# Patient Record
Sex: Male | Born: 1995 | Race: Black or African American | Hispanic: No | Marital: Single | Smoking: Never smoker
Health system: Southern US, Community
[De-identification: ages and names within clinical notes are randomized; demographics above are authoritative.]

## PROBLEM LIST (undated history)

## (undated) DIAGNOSIS — L309 Dermatitis, unspecified: Secondary | ICD-10-CM

## (undated) HISTORY — DX: Dermatitis, unspecified: L30.9

---
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
## 2014-04-28 ENCOUNTER — Emergency Department (INDEPENDENT_AMBULATORY_CARE_PROVIDER_SITE_OTHER)
Admission: EM | Admit: 2014-04-28 | Discharge: 2014-04-28 | Disposition: A | Discharge status: Home / Self Care | Payer: Medicaid Other | Source: Home / Self Care | Attending: Family Medicine | Admitting: Family Medicine

## 2014-04-28 ENCOUNTER — Encounter (HOSPITAL_COMMUNITY): Payer: Self-pay | Admitting: Emergency Medicine

## 2014-04-28 DIAGNOSIS — N508 Other specified disorders of male genital organs: Secondary | ICD-10-CM

## 2014-04-28 DIAGNOSIS — N5082 Scrotal pain: Secondary | ICD-10-CM

## 2014-04-28 MED ORDER — DOXYCYCLINE HYCLATE 100 MG PO CAPS: 100.0000 mg | ORAL_CAPSULE | Freq: Two times a day (BID) | ORAL | Status: DC

## 2014-04-28 NOTE — ED Provider Notes (Signed)
CSN: [id number]     Arrival date & time 04/28/14  [id number] History   First MD Initiated Contact with Patient 04/28/14 1047     Chief Complaint  Patient presents with  . Groin Swelling   (Consider location/radiation/quality/duration/timing/severity/associated sxs/prior Treatment) Patient is a 19 y.o. male presenting with male genitourinary complaint.  Male GU Problem Presenting symptoms: scrotal pain   Presenting symptoms: no penile discharge and no penile pain   Context comment:  Left scrotal swelling this am, now resolved. Relieved by:  None tried Associated symptoms: scrotal swelling   Associated symptoms: no abdominal pain, no diarrhea, no fever, no genital itching, no genital lesions, no groin pain, no hematuria, no penile redness and no penile swelling   Associated symptoms comment:  Had std screen last week.   No past medical history on file. No past surgical history on file. No family history on file. History  Substance Use Topics  . Smoking status: Never Smoker   . Smokeless tobacco: Not on file  . Alcohol Use: No    Review of Systems  Constitutional: Negative.  Negative for fever.  Gastrointestinal: Negative for abdominal pain and diarrhea.  Genitourinary: Positive for scrotal swelling. Negative for hematuria, discharge, penile swelling, penile pain and testicular pain.  Musculoskeletal: Negative.     Allergies  Review of patient's allergies indicates no known allergies.  Home Medications   Prior to Admission medications   Medication Sig Start Date End Date Taking? Authorizing Provider  acetaminophen-codeine (TYLENOL #3) 300-30 MG per tablet Take 1 tablet by mouth every 6 (six) hours as needed for pain. 01/10/12   Adlih Moreno-Coll, MD  amoxicillin (AMOXIL) 500 MG capsule Take 1 capsule (500 mg total) by mouth 3 (three) times daily. 01/10/12   Adlih Moreno-Coll, MD  doxycycline (VIBRAMYCIN) 100 MG capsule Take 1 capsule (100 mg total) by mouth 2 (two) times daily.  04/28/14   Linna HoffJames D Parish Augustine, MD  fexofenadine (ALLEGRA) 180 MG tablet Take 1 tablet (180 mg total) by mouth daily. 05/04/12   Linna HoffJames D Welda Azzarello, MD  fluticasone (FLONASE) 50 MCG/ACT nasal spray Place 1 spray into the nose 2 (two) times daily. 05/04/12   Linna HoffJames D Vallie Teters, MD  ibuprofen (ADVIL,MOTRIN) 600 MG tablet Take 1 tablet (600 mg total) by mouth every 8 (eight) hours as needed for pain or fever. 01/10/12   Adlih Moreno-Coll, MD   BP 121/80 mmHg  Pulse 109  Temp(Src) 98.2 F (36.8 C) (Oral)  Resp 14  SpO2 100% Physical Exam  Constitutional: He is oriented to person, place, and time. He appears well-developed and well-nourished.  Abdominal: Soft. Bowel sounds are normal.  Genitourinary: Penis normal. No penile tenderness.  Nl exam at present, no sx.  Neurological: He is alert and oriented to person, place, and time.  Skin: Skin is warm and dry.  Nursing note and vitals reviewed.   ED Course  Procedures (including critical care time) Labs Review Labs Reviewed - No data to display  Imaging Review No results found.   MDM   1. Scrotal pain        Linna HoffJames D Joshua Zeringue, MD 04/28/14 1115

## 2014-04-28 NOTE — Discharge Instructions (Signed)
Use medicine as needed if problem comes back.

## 2014-04-28 NOTE — ED Notes (Signed)
Pt states that he woke up with his testicles swollen this morning

## 2015-08-09 ENCOUNTER — Emergency Department (HOSPITAL_COMMUNITY)
Admission: EM | Admit: 2015-08-09 | Discharge: 2015-08-10 | Disposition: A | Discharge status: Home / Self Care | Attending: Emergency Medicine | Admitting: Emergency Medicine

## 2015-08-09 ENCOUNTER — Encounter (HOSPITAL_COMMUNITY): Payer: Self-pay | Admitting: Emergency Medicine

## 2015-08-09 DIAGNOSIS — Y92410 Unspecified street and highway as the place of occurrence of the external cause: Secondary | ICD-10-CM | POA: Insufficient documentation

## 2015-08-09 DIAGNOSIS — M791 Myalgia: Secondary | ICD-10-CM | POA: Diagnosis not present

## 2015-08-09 DIAGNOSIS — Y939 Activity, unspecified: Secondary | ICD-10-CM | POA: Diagnosis not present

## 2015-08-09 DIAGNOSIS — R51 Headache: Secondary | ICD-10-CM | POA: Diagnosis present

## 2015-08-09 DIAGNOSIS — Y999 Unspecified external cause status: Secondary | ICD-10-CM | POA: Insufficient documentation

## 2015-08-09 DIAGNOSIS — Z79899 Other long term (current) drug therapy: Secondary | ICD-10-CM | POA: Diagnosis not present

## 2015-08-09 DIAGNOSIS — M7918 Myalgia, other site: Secondary | ICD-10-CM

## 2015-08-09 MED ORDER — NAPROXEN 500 MG PO TABS: 500.0000 mg | ORAL_TABLET | Freq: Two times a day (BID) | ORAL | 0 refills | Status: DC

## 2015-08-09 NOTE — ED Notes (Signed)
PA at bedside.
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
## 2015-08-09 NOTE — Discharge Instructions (Signed)
Apply ice to affected area. Take Naprosyn as needed for pain and inflammation. Follow up with your primary care doctor for symptoms not improved. Return to the emergency department if you experience loss of consciousness, slurred speech, vomiting, blurry vision, loss of control of your bowel or bladder, numbness and tingling in both of the lower extremities, weakness.
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
## 2015-08-29 ENCOUNTER — Encounter (HOSPITAL_COMMUNITY): Payer: Self-pay | Admitting: Emergency Medicine

## 2015-08-29 ENCOUNTER — Emergency Department (HOSPITAL_COMMUNITY)

## 2015-08-29 DIAGNOSIS — Z5321 Procedure and treatment not carried out due to patient leaving prior to being seen by health care provider: Secondary | ICD-10-CM | POA: Insufficient documentation

## 2015-08-29 DIAGNOSIS — R079 Chest pain, unspecified: Secondary | ICD-10-CM | POA: Insufficient documentation

## 2015-08-29 LAB — I-STAT TROPONIN, ED: Troponin i, poc: 0.01 ng/mL (ref 0.00–0.08)

## 2015-08-29 LAB — CBC
HEMATOCRIT: 41.4 % (ref 39.0–52.0)
HEMOGLOBIN: 14.1 g/dL (ref 13.0–17.0)
MCH: 29.7 pg (ref 26.0–34.0)
MCHC: 34.1 g/dL (ref 30.0–36.0)
MCV: 87.3 fL (ref 78.0–100.0)
Platelets: 233 10*3/uL (ref 150–400)
RBC: 4.74 MIL/uL (ref 4.22–5.81)
RDW: 12.1 % (ref 11.5–15.5)
WBC: 7.5 10*3/uL (ref 4.0–10.5)

## 2015-08-29 LAB — BASIC METABOLIC PANEL
ANION GAP: 4 — AB (ref 5–15)
BUN: 10 mg/dL (ref 6–20)
CO2: 29 mmol/L (ref 22–32)
Calcium: 9.5 mg/dL (ref 8.9–10.3)
Chloride: 106 mmol/L (ref 101–111)
Creatinine, Ser: 1.23 mg/dL (ref 0.61–1.24)
GFR calc Af Amer: >60 mL/min (ref >60)
GFR calc non Af Amer: >60 mL/min (ref >60)
GLUCOSE: 101 mg/dL — AB (ref 65–99)
POTASSIUM: 4 mmol/L (ref 3.5–5.1)
Sodium: 139 mmol/L (ref 135–145)

## 2015-08-29 NOTE — ED Triage Notes (Signed)
Pt. reports central / left chest pain with SOB onset 4 days ago , denies nausea or diaphoresis .

## 2015-08-30 ENCOUNTER — Emergency Department (HOSPITAL_COMMUNITY)
Admission: EM | Admit: 2015-08-30 | Discharge: 2015-08-30 | Disposition: A | Discharge status: Home / Self Care | Attending: Dermatology | Admitting: Dermatology

## 2015-08-30 ENCOUNTER — Emergency Department (HOSPITAL_COMMUNITY)
Admission: EM | Admit: 2015-08-30 | Discharge: 2015-08-30 | Disposition: A | Discharge status: Home / Self Care | Attending: Emergency Medicine | Admitting: Emergency Medicine

## 2015-08-30 ENCOUNTER — Encounter (HOSPITAL_COMMUNITY): Payer: Self-pay | Admitting: Emergency Medicine

## 2015-08-30 ENCOUNTER — Emergency Department (HOSPITAL_COMMUNITY)

## 2015-08-30 DIAGNOSIS — Z5321 Procedure and treatment not carried out due to patient leaving prior to being seen by health care provider: Secondary | ICD-10-CM | POA: Insufficient documentation

## 2015-08-30 DIAGNOSIS — R079 Chest pain, unspecified: Secondary | ICD-10-CM | POA: Insufficient documentation

## 2015-08-30 LAB — BASIC METABOLIC PANEL
Anion gap: 7 (ref 5–15)
BUN: 16 mg/dL (ref 6–20)
CALCIUM: 9.2 mg/dL (ref 8.9–10.3)
CHLORIDE: 107 mmol/L (ref 101–111)
CO2: 26 mmol/L (ref 22–32)
CREATININE: 1.02 mg/dL (ref 0.61–1.24)
GFR calc non Af Amer: >60 mL/min (ref >60)
Glucose, Bld: 97 mg/dL (ref 65–99)
Potassium: 3.8 mmol/L (ref 3.5–5.1)
SODIUM: 140 mmol/L (ref 135–145)

## 2015-08-30 LAB — I-STAT TROPONIN, ED: TROPONIN I, POC: 0.01 ng/mL (ref 0.00–0.08)

## 2015-08-30 LAB — CBC
HCT: 40.9 % (ref 39.0–52.0)
Hemoglobin: 14 g/dL (ref 13.0–17.0)
MCH: 29.4 pg (ref 26.0–34.0)
MCHC: 34.2 g/dL (ref 30.0–36.0)
MCV: 85.9 fL (ref 78.0–100.0)
PLATELETS: 251 10*3/uL (ref 150–400)
RBC: 4.76 MIL/uL (ref 4.22–5.81)
RDW: 12.5 % (ref 11.5–15.5)
WBC: 8.3 10*3/uL (ref 4.0–10.5)

## 2015-08-30 NOTE — ED Triage Notes (Signed)
Pt states for the past 4 days he has been getting chest pain with strenuous activity   Pt states his heart rate goes up and he has some shortness of breath too  Pt states it takes a while for his heart rate to come down after he stops to rest

## 2015-08-30 NOTE — ED Notes (Signed)
Family is taking the pt to [place] ed  They are tired of waiting here

## 2015-08-30 NOTE — ED Notes (Signed)
Family member asking about the wait time  Time given  Will wait for now

## 2015-08-30 NOTE — ED Notes (Signed)
Pt informed registration that he was leaving.

## 2016-06-05 ENCOUNTER — Encounter: Payer: Self-pay | Admitting: Podiatry

## 2016-06-05 ENCOUNTER — Ambulatory Visit (INDEPENDENT_AMBULATORY_CARE_PROVIDER_SITE_OTHER): Admitting: Podiatry

## 2016-06-05 VITALS — BP 119/67 | HR 80 | Resp 16 | Ht 72.0 in | Wt 180.0 lb

## 2016-06-05 DIAGNOSIS — L600 Ingrowing nail: Secondary | ICD-10-CM | POA: Diagnosis not present

## 2016-06-05 NOTE — Patient Instructions (Signed)

## 2016-06-05 NOTE — Progress Notes (Signed)
   Subjective:    Patient ID: Albert Moon, male    DOB: 05/22/1995, 21 y.o.   MRN: [id number]  HPI Chief Complaint  Patient presents with  . Nail Problem    Left foot; great toe-medial side; pt stated, "Has had for the past 2 years"      Review of Systems  Musculoskeletal: Positive for back pain and myalgias.  All other systems reviewed and are negative.      Objective:   Physical Exam        Assessment & Plan:

## 2016-06-05 NOTE — Progress Notes (Signed)
Subjective:    Patient ID: Albert Moon, male   DOB: 21 y.o.   MRN: [id number]   HPI patient states I have a painful left hallux nail and states that he's tried to trim it himself and soak without relief    ROS      Objective:  Physical Exam Neurovascular status intact with incurvated left hallux medial border that's painful when pressed and makes shoe gear difficult    Assessment:   Ingrown toenail deformity left hallux medial border with pain distal redness but no active drainage      Plan:    H&P condition reviewed at great length and discussed correction. He wants this done and I explained risk and today I infiltrated the left hallux 60 Milligan times like Marcaine mixture remove the border exposed matrix and applied phenol 3 applications 30 seconds followed by alcohol lavaged sterile dressing. Gave instructions on soaks and reappoint

## 2018-03-18 IMAGING — CR DG CHEST 2V
2 series · 2 of 2 positions shown · non-contrast
Comparison: None.

CLINICAL DATA: 20-year-old male with substernal chest pain
shortness of breath and tachycardia for 4 days. Initial encounter.

EXAM:
CHEST  2 VIEW

[chest pa]
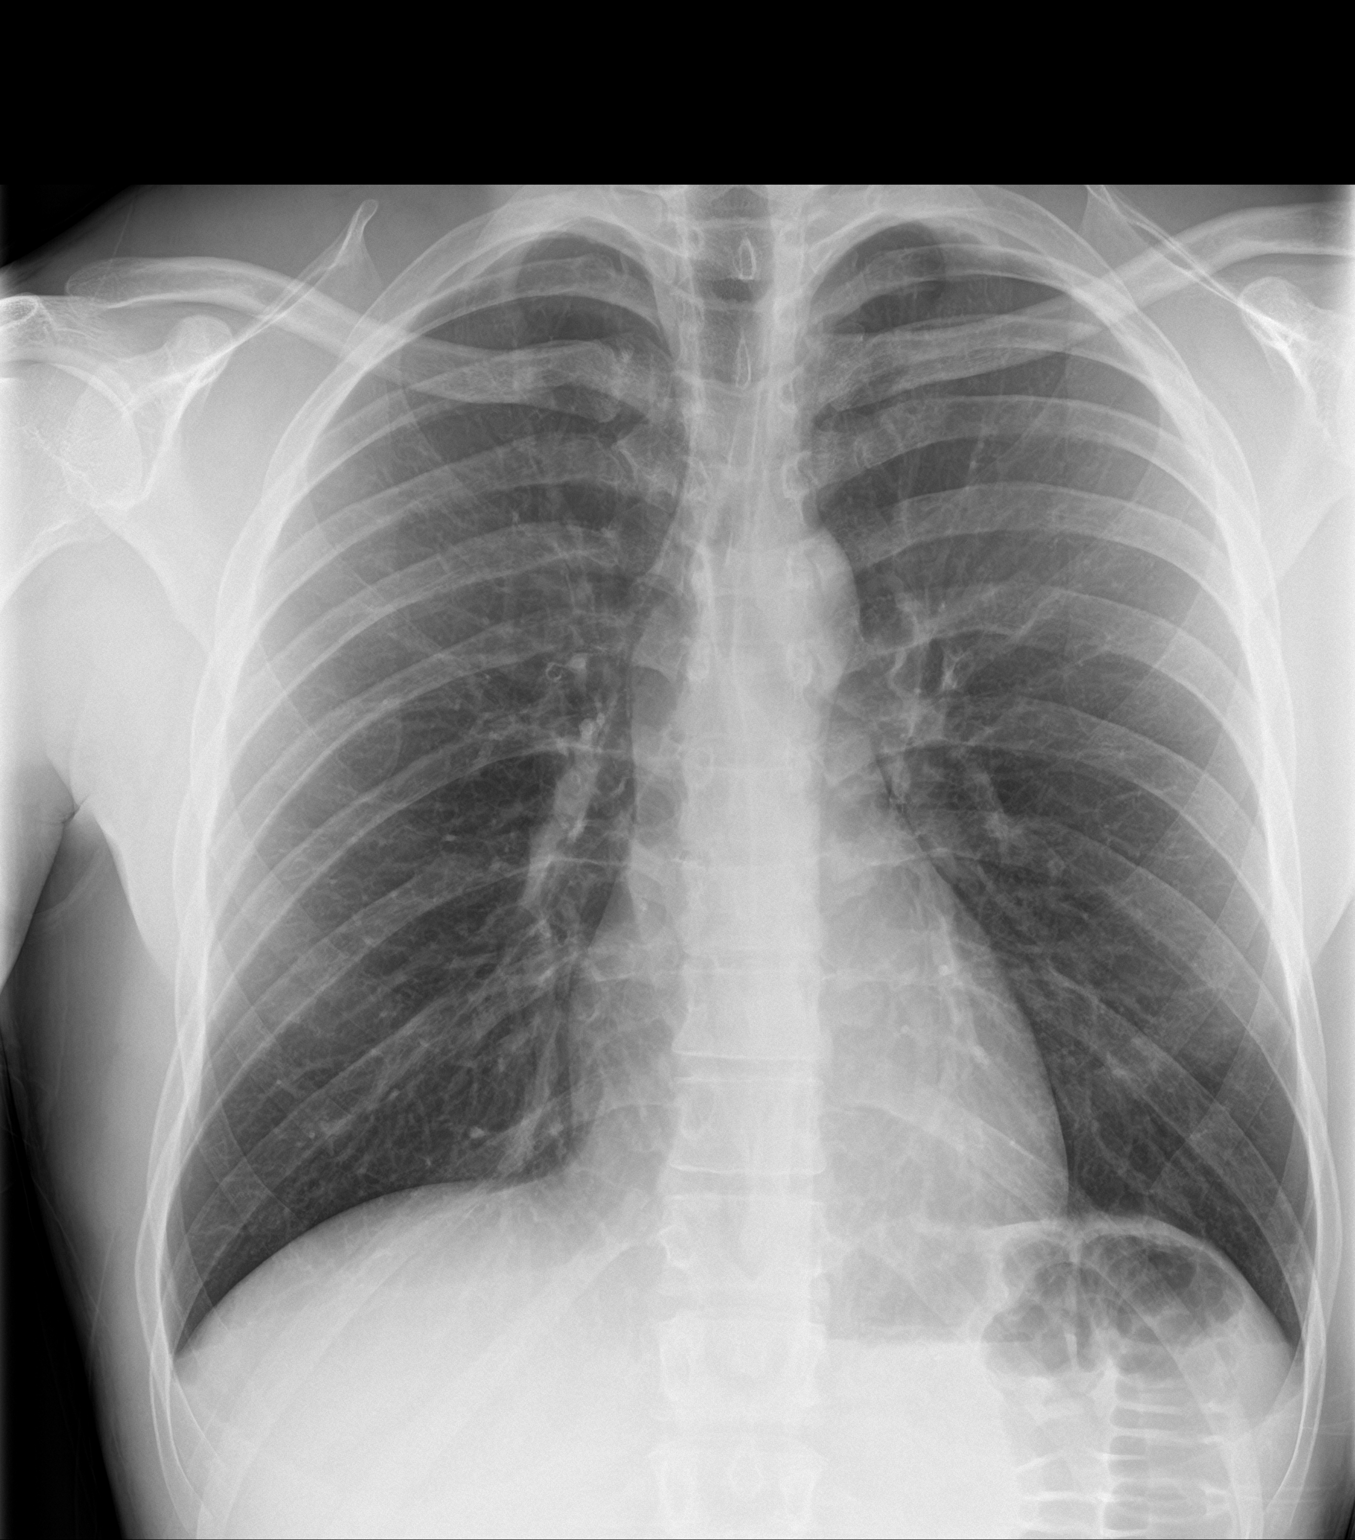

[chest lat]
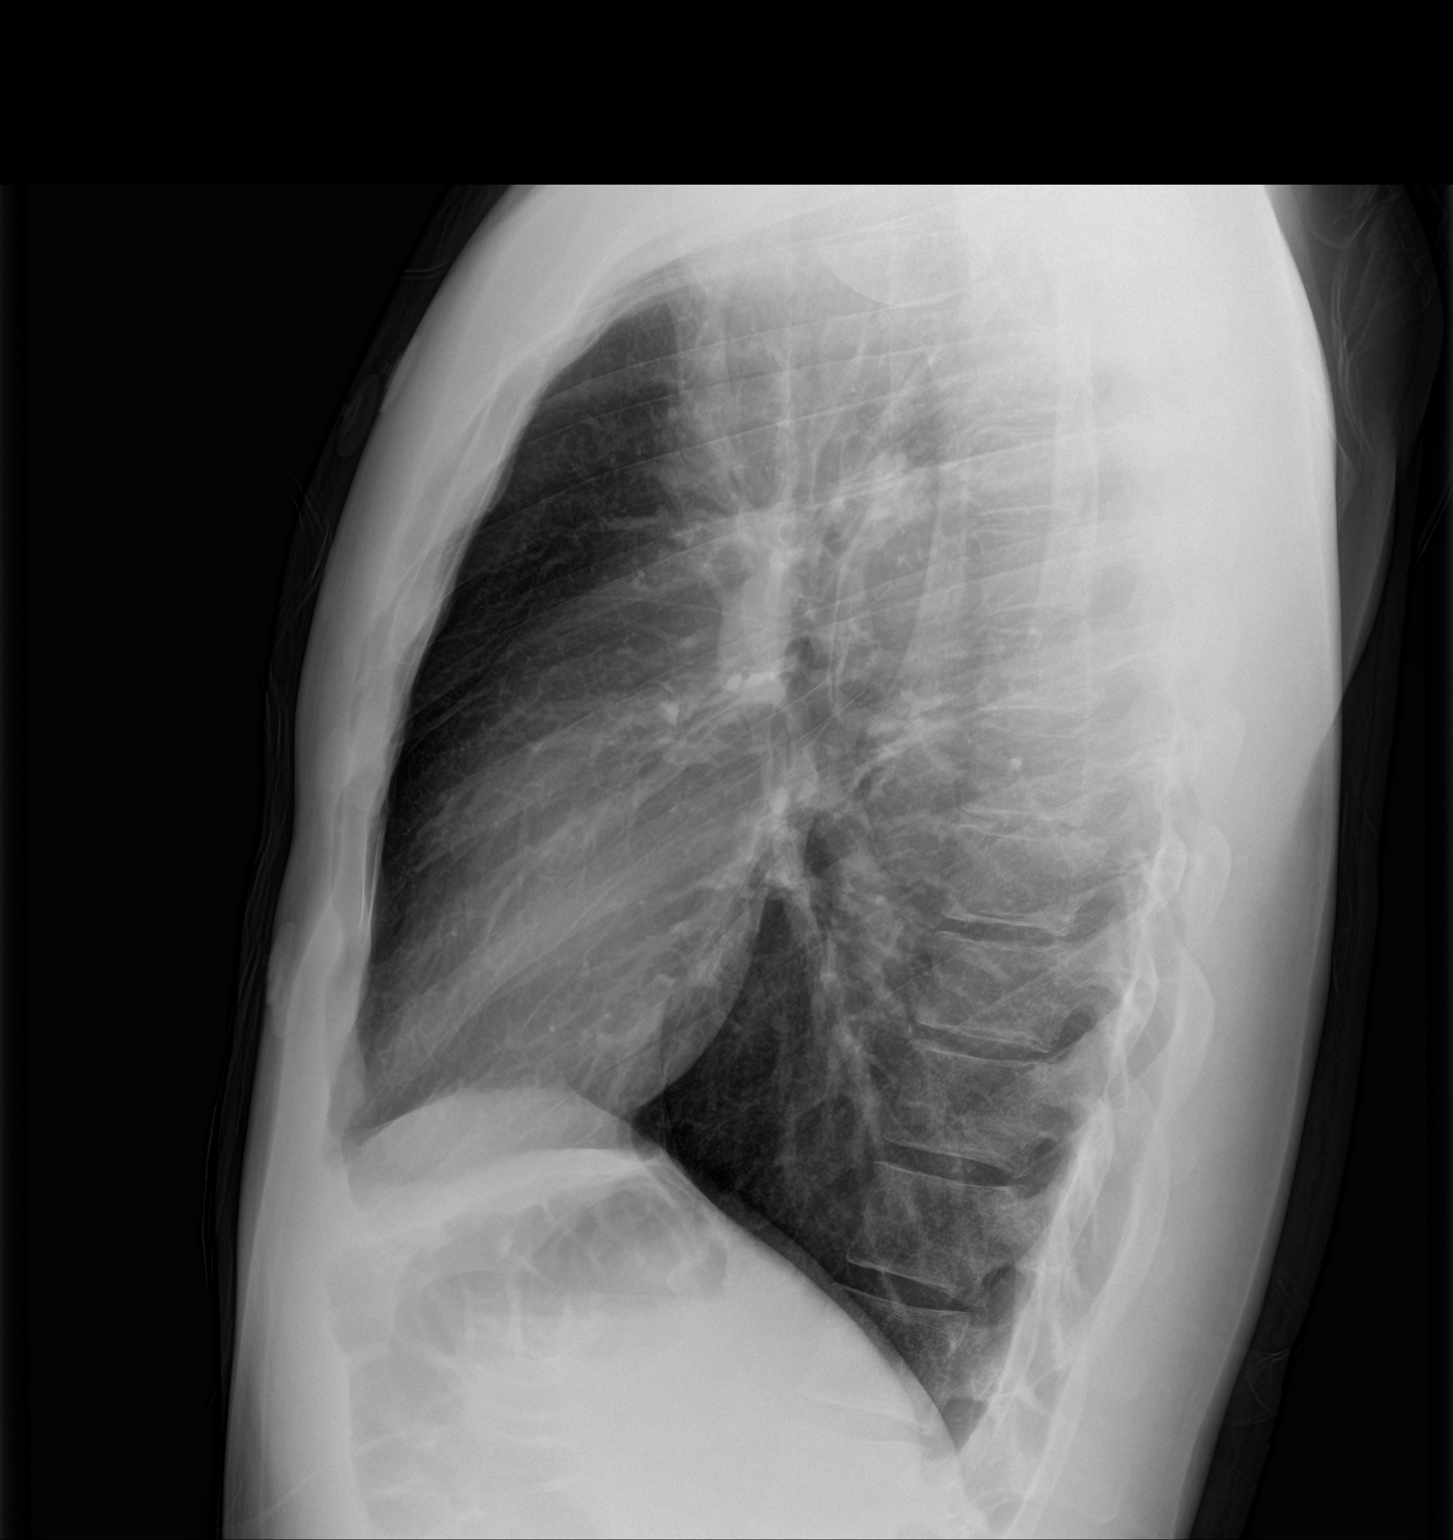

[2 of 2 positions shown; findings below may reference images not displayed]

FINDINGS: Normal lung volumes. Normal cardiac size and mediastinal contours.
Visualized tracheal air column is within normal limits. The lungs
are clear. No pneumothorax or pleural effusion. No osseous
abnormality identified.
IMPRESSION: Negative, no acute cardiopulmonary abnormality.

## 2018-03-19 IMAGING — CR DG CHEST 2V
2 series · 2 of 2 positions shown · non-contrast
Comparison: Chest radiograph performed 08/29/2015

CLINICAL DATA: Acute onset of upper mid chest pain. Shortness of
breath. Initial encounter.

EXAM:
CHEST  2 VIEW

[w chest pa]
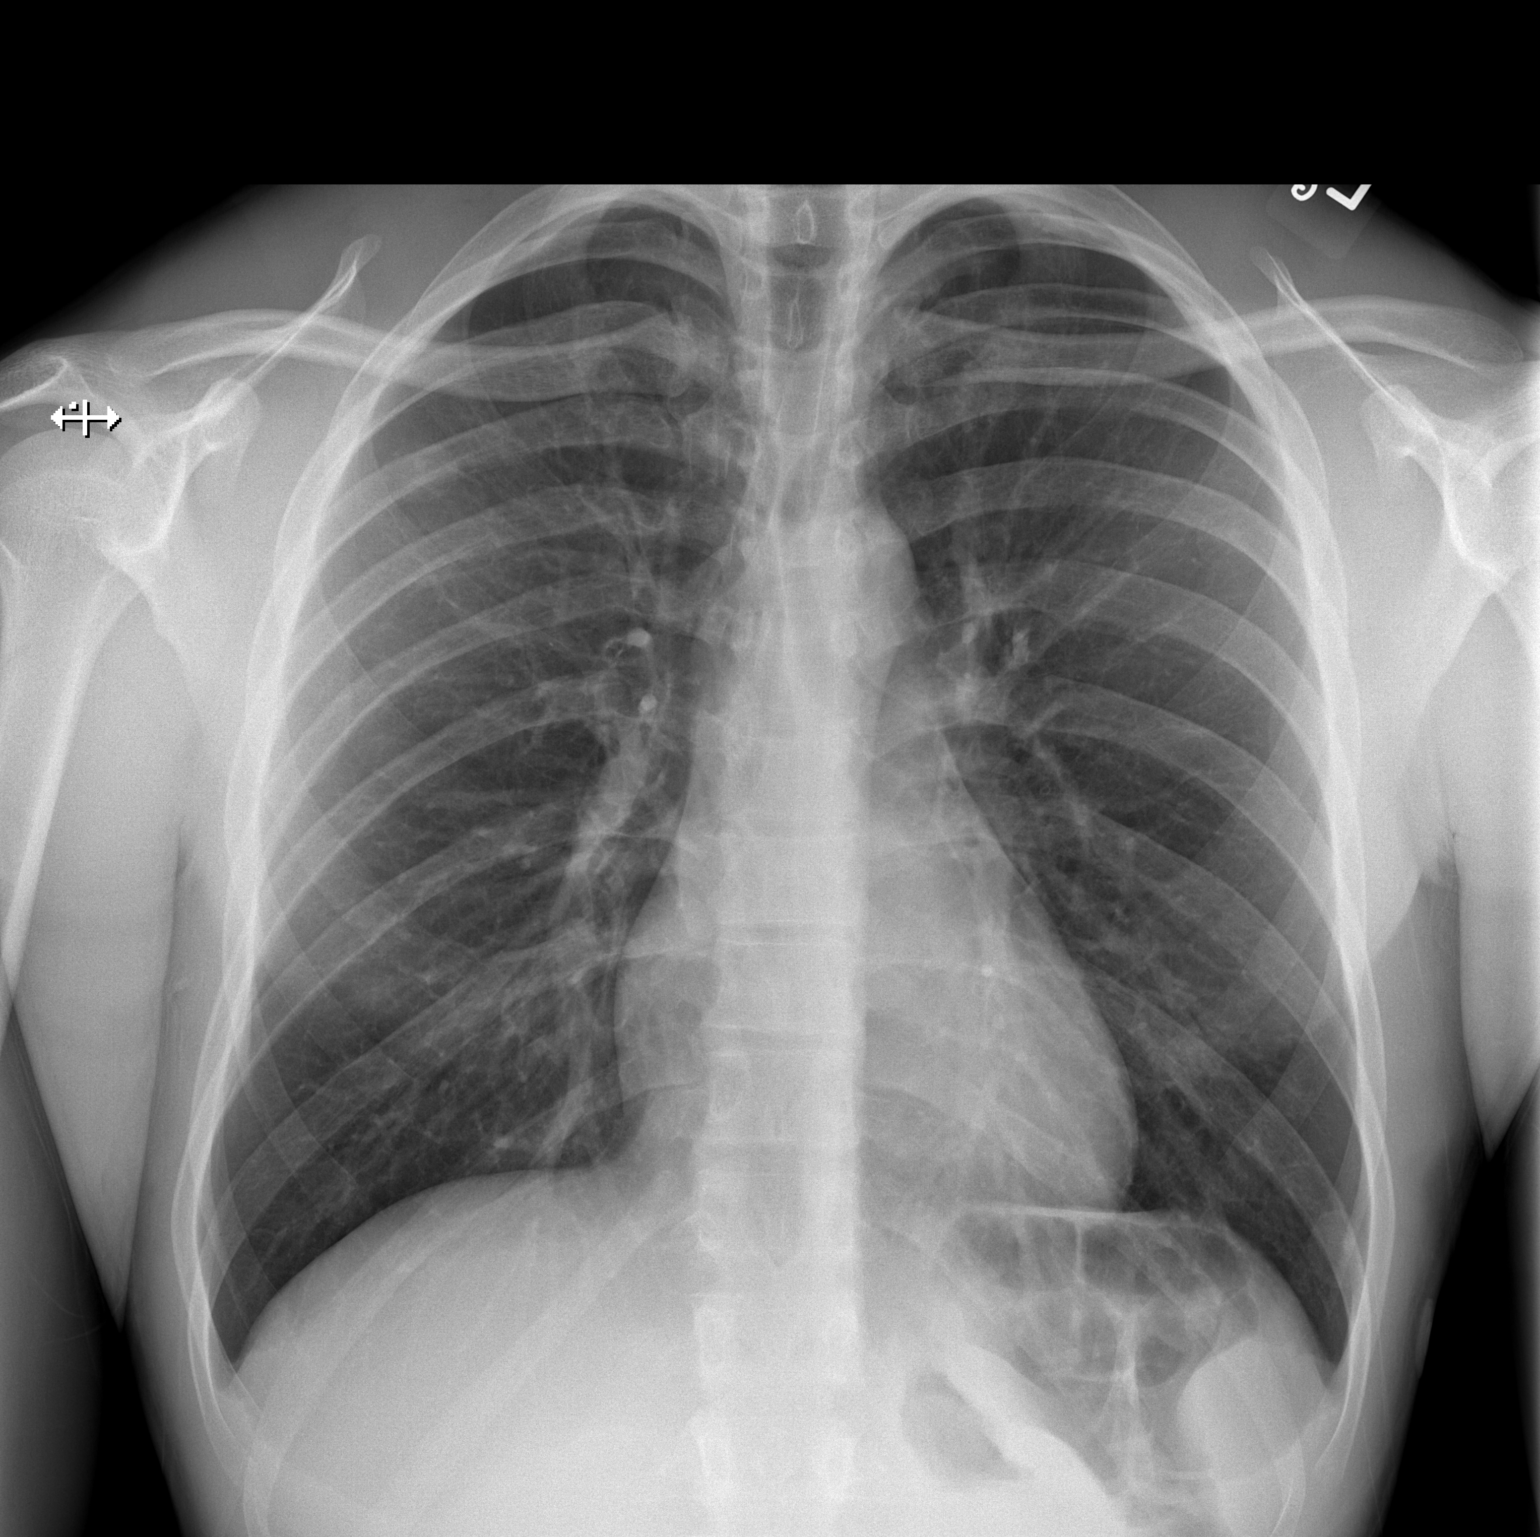

[w chest lat]
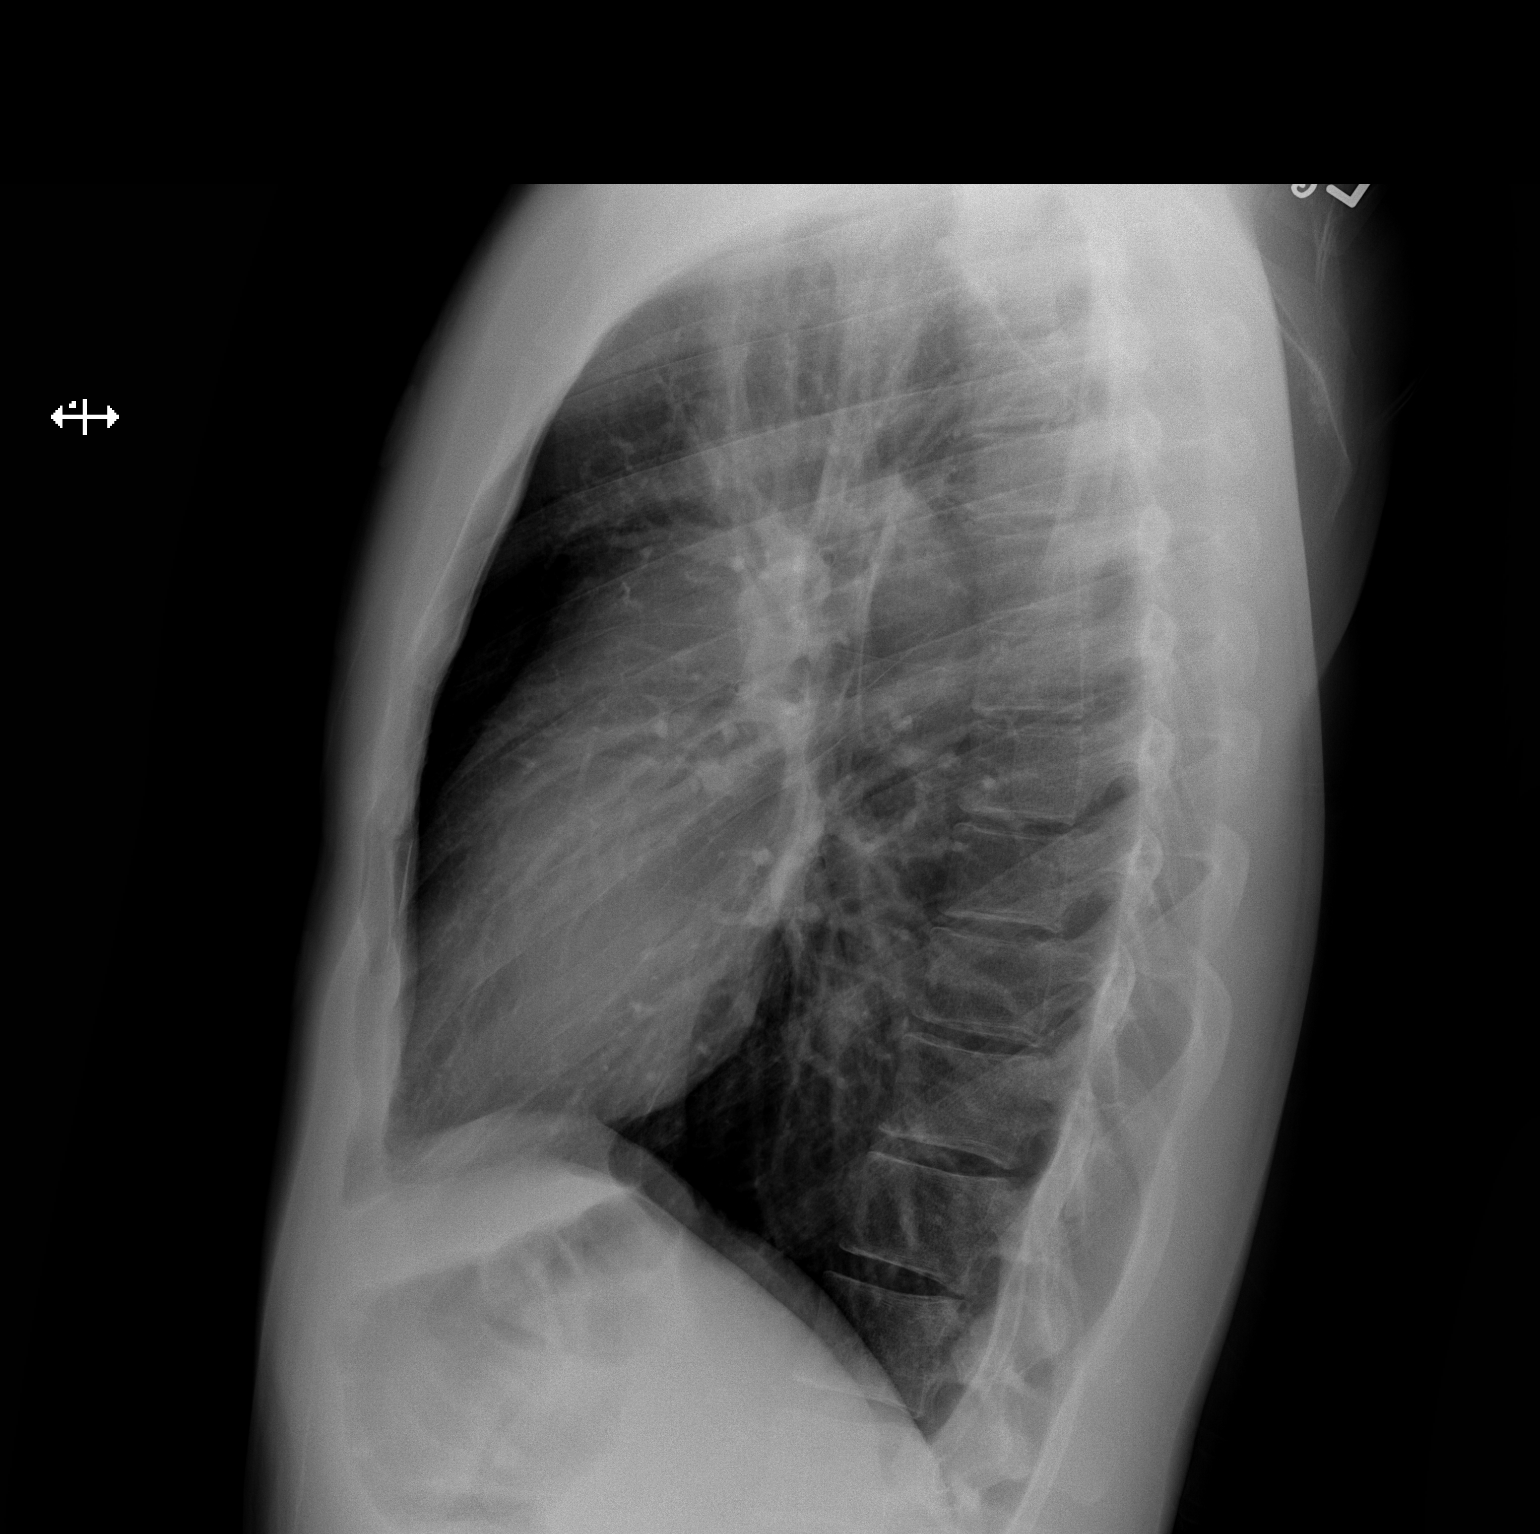

[2 of 2 positions shown; findings below may reference images not displayed]

FINDINGS: The lungs are well-aerated and clear. There is no evidence of focal
opacification, pleural effusion or pneumothorax.

The heart is normal in size; the mediastinal contour is within
normal limits. No acute osseous abnormalities are seen.
IMPRESSION: No acute cardiopulmonary process seen.

## 2018-04-13 ENCOUNTER — Encounter (HOSPITAL_COMMUNITY): Payer: Self-pay | Admitting: Emergency Medicine

## 2018-04-13 ENCOUNTER — Ambulatory Visit (HOSPITAL_COMMUNITY)
Admission: EM | Admit: 2018-04-13 | Discharge: 2018-04-13 | Disposition: A | Discharge status: Home / Self Care | Payer: 59 | Attending: Family Medicine | Admitting: Family Medicine

## 2018-04-13 DIAGNOSIS — S61411A Laceration without foreign body of right hand, initial encounter: Secondary | ICD-10-CM | POA: Diagnosis not present

## 2018-04-13 NOTE — ED Notes (Signed)
Placed right hand in warm soapy water

## 2018-04-13 NOTE — ED Triage Notes (Signed)
Pt states he punched a glass window and cut the top of his R hand.

## 2018-04-13 NOTE — ED Provider Notes (Signed)
[facility]    CSN: [id number] Arrival date & time: 04/13/18  1709     History   Chief Complaint Chief Complaint  Patient presents with  . Extremity Laceration    HPI Albert Moon is a 23 y.o. male.   HPI  Patient punched a glass window in anger.  He has multiple lacerations.  Bleeding controlled with pressure. Patient states his tetanus is up-to-date. He has no other health concerns  History reviewed. No pertinent past medical history.  There are no active problems to display for this patient.   History reviewed. No pertinent surgical history.     Home Medications    Prior to Admission medications   Medication Sig Start Date End Date Taking? Authorizing Provider  fexofenadine (ALLEGRA) 180 MG tablet Take 1 tablet (180 mg total) by mouth daily. 05/04/12   Linna Hoff, MD    Family History Family History  Problem Relation Age of Onset  . Diabetes Other   . Hypertension Other     Social History Social History   Tobacco Use  . Smoking status: Never Smoker  . Smokeless tobacco: Never Used  Substance Use Topics  . Alcohol use: No  . Drug use: No     Allergies   Patient has no known allergies.   Review of Systems Review of Systems  Skin: Positive for wound.     Physical Exam Triage Vital Signs ED Triage Vitals [04/13/18 1728]  Enc Vitals Group     BP 133/71     Pulse Rate 75     Resp 16     Temp 98.6 F (37 C)     Temp src      SpO2 100 %     Weight      Height      Head Circumference      Peak Flow      Pain Score 3     Pain Loc      Pain Edu?      Excl. in [place]?    No data found.  Updated Vital Signs BP 133/71   Pulse 75   Temp 98.6 F (37 C)   Resp 16   SpO2 100%   Visual Acuity Right Eye Distance:   Left Eye Distance:   Bilateral Distance:    Right Eye Near:   Left Eye Near:    Bilateral Near:     Physical Exam Constitutional:      General: He is not in acute distress.    Appearance: Normal  appearance. He is well-developed and normal weight.  HENT:     Head: Normocephalic and atraumatic.  Eyes:     Conjunctiva/sclera: Conjunctivae normal.     Pupils: Pupils are equal, round, and reactive to light.  Neck:     Musculoskeletal: Normal range of motion.  Cardiovascular:     Rate and Rhythm: Normal rate.  Pulmonary:     Effort: Pulmonary effort is normal. No respiratory distress.  Abdominal:     General: There is no distension.     Palpations: Abdomen is soft.  Musculoskeletal: Normal range of motion.       Hands:  Skin:    General: Skin is warm and dry.  Neurological:     General: No focal deficit present.     Mental Status: He is alert.  Psychiatric:        Mood and Affect: Mood normal.        Behavior: Behavior  normal.     Comments: Discussed impulse control      UC Treatments / Results  Labs (all labs ordered are listed, but only abnormal results are displayed) Labs Reviewed - No data to display  EKG None  Radiology No results found.  Procedures Laceration Repair Date/Time: 04/13/2018 6:52 PM Performed by: Eustace Moore, MD Authorized by: Eustace Moore, MD   Consent:    Consent obtained:  Verbal   Consent given by:  Patient   Risks discussed:  Infection, poor cosmetic result and retained foreign body   Alternatives discussed:  No treatment Anesthesia (see MAR for exact dosages):    Anesthesia method:  Local infiltration   Local anesthetic:  Lidocaine 2% w/o epi Laceration details:    Location:  Hand   Hand location:  R hand, dorsum   Length (cm):  5   Depth (mm):  2 Repair type:    Repair type:  Simple Pre-procedure details:    Preparation:  Patient was prepped and draped in usual sterile fashion Exploration:    Hemostasis achieved with:  Direct pressure   Wound exploration: wound explored through full range of motion and entire depth of wound probed and visualized     Wound extent: no foreign bodies/material noted, no muscle  damage noted, no nerve damage noted, no tendon damage noted and no vascular damage noted     Contaminated: no   Treatment:    Area cleansed with:  Soap and water   Amount of cleaning:  Standard Skin repair:    Repair method:  Sutures   Suture size:  4-0   Suture material:  Prolene   Suture technique:  Simple interrupted   Number of sutures:  8 Approximation:    Approximation:  Close Post-procedure details:    Dressing:  Antibiotic ointment and non-adherent dressing   Patient tolerance of procedure:  Tolerated well, no immediate complications Comments:     Total length of the 3 lacerations was 5 cm.  Total number of sutures to close the 3 was 8   (including critical care time)  Medications Ordered in UC Medications - No data to display  Initial Impression / Assessment and Plan / UC Course  I have reviewed the triage vital signs and the nursing notes.  Pertinent labs & imaging results that were available during my care of the patient were reviewed by me and considered in my medical decision making (see chart for details).      Final Clinical Impressions(s) / UC Diagnoses   Final diagnoses:  Laceration of right hand without foreign body, initial encounter     Discharge Instructions     Keep clean and reasonably dry Watch for infection Stitches out in 7-10 days If you return here call for appointment    ED Prescriptions    None     Controlled Substance Prescriptions [place] Controlled Substance Registry consulted? Not Applicable   Eustace Moore, MD 04/13/18 [phone]

## 2018-04-13 NOTE — Discharge Instructions (Addendum)
Keep clean and reasonably dry Watch for infection Stitches out in 7-10 days If you return here call for appointment

## 2018-04-14 ENCOUNTER — Encounter (HOSPITAL_COMMUNITY): Payer: Self-pay

## 2018-04-14 ENCOUNTER — Other Ambulatory Visit: Payer: Self-pay

## 2018-04-14 ENCOUNTER — Emergency Department (HOSPITAL_COMMUNITY)
Admission: EM | Admit: 2018-04-14 | Discharge: 2018-04-14 | Disposition: A | Discharge status: Home / Self Care | Payer: 59 | Attending: Emergency Medicine | Admitting: Emergency Medicine

## 2018-04-14 DIAGNOSIS — Z79899 Other long term (current) drug therapy: Secondary | ICD-10-CM | POA: Diagnosis not present

## 2018-04-14 DIAGNOSIS — Z9889 Other specified postprocedural states: Secondary | ICD-10-CM | POA: Insufficient documentation

## 2018-04-14 DIAGNOSIS — L089 Local infection of the skin and subcutaneous tissue, unspecified: Secondary | ICD-10-CM

## 2018-04-14 DIAGNOSIS — L0889 Other specified local infections of the skin and subcutaneous tissue: Secondary | ICD-10-CM | POA: Insufficient documentation

## 2018-04-14 DIAGNOSIS — T148XXA Other injury of unspecified body region, initial encounter: Secondary | ICD-10-CM

## 2018-04-14 MED ORDER — DOXYCYCLINE HYCLATE 100 MG PO CAPS: 100.0000 mg | ORAL_CAPSULE | Freq: Two times a day (BID) | ORAL | 0 refills | Status: DC

## 2018-04-14 MED ORDER — BACITRACIN ZINC 500 UNIT/GM EX OINT
1.0000 "application " | TOPICAL_OINTMENT | Freq: Two times a day (BID) | CUTANEOUS | Status: DC
  Administered 2018-04-14: 1 via TOPICAL
  Filled 2018-04-14: qty 0.9

## 2018-04-14 MED ORDER — LIDOCAINE HCL 1 % IJ SOLN
INTRAMUSCULAR | Status: AC
  Filled 2018-04-14: qty 20

## 2018-04-14 MED ORDER — DOXYCYCLINE HYCLATE 100 MG PO TABS
100.0000 mg | ORAL_TABLET | Freq: Once | ORAL | Status: AC
  Administered 2018-04-14: 100 mg via ORAL
  Filled 2018-04-14: qty 1

## 2018-04-14 NOTE — ED Triage Notes (Signed)
Pt was seen here yesterday and had stitches placed in right hand. Arrived today with obvious swelling noted around site. Some redness noted.

## 2018-04-14 NOTE — ED Provider Notes (Signed)
[facility]-EMERGENCY DEPT Provider Note   CSN: [id number] Arrival date & time: 04/14/18  2228    History   Chief Complaint Chief Complaint  Patient presents with  . Wound Infection    Stitches    HPI Albert Moon is a 23 y.o. male.     Patient presents to the ED with a chief complaint of wound infection.  He states that he cut his right hand when punching some glass yesterday morning.  He had his hand stitched up yesterday evening.  He reports that he has developed redness and swelling around the wound.  He denies any fever or discharge.  States mild increase in pain.  Denies any other associated symptoms.  The history is provided by the patient. No language interpreter was used.    History reviewed. No pertinent past medical history.  There are no active problems to display for this patient.   History reviewed. No pertinent surgical history.      Home Medications    Prior to Admission medications   Medication Sig Start Date End Date Taking? Authorizing Provider  fexofenadine (ALLEGRA) 180 MG tablet Take 1 tablet (180 mg total) by mouth daily. 05/04/12   Linna Hoff, MD    Family History Family History  Problem Relation Age of Onset  . Diabetes Other   . Hypertension Other     Social History Social History   Tobacco Use  . Smoking status: Never Smoker  . Smokeless tobacco: Never Used  Substance Use Topics  . Alcohol use: No  . Drug use: No     Allergies   Patient has no known allergies.   Review of Systems Review of Systems  All other systems reviewed and are negative.    Physical Exam Updated Vital Signs BP 116/72 (BP Location: Right Arm)   Pulse 88   Temp 97.6 F (36.4 C) (Oral)   Resp 16   Ht 6' (1.829 m)   Wt 83.9 kg   SpO2 99%   BMI 25.09 kg/m   Physical Exam Vitals signs and nursing note reviewed.  Constitutional:      General: He is not in acute distress.    Appearance: He is not diaphoretic.   HENT:     Head: Normocephalic and atraumatic.  Eyes:     Conjunctiva/sclera: Conjunctivae normal.     Pupils: Pupils are equal, round, and reactive to light.  Neck:     Trachea: No tracheal deviation.  Cardiovascular:     Rate and Rhythm: Normal rate.  Pulmonary:     Effort: Pulmonary effort is normal. No respiratory distress.  Abdominal:     Palpations: Abdomen is soft.  Musculoskeletal: Normal range of motion.  Skin:    General: Skin is warm and dry.     Comments: Mild erythema surrounding sutured lacerations on the posterior right hand No erythema about the wrist or palmar aspect ROM and strength of wrist and fingers 5/5  Neurological:     Mental Status: He is alert and oriented to person, place, and time.  Psychiatric:        Judgment: Judgment normal.      ED Treatments / Results  Labs (all labs ordered are listed, but only abnormal results are displayed) Labs Reviewed - No data to display  EKG None  Radiology No results found.  Procedures Procedures (including critical care time) SUTURE REMOVAL Performed by: Roxy Horseman  Consent: Verbal consent obtained. Patient identity confirmed: provided demographic data  Time out: Immediately prior to procedure a "time out" was called to verify the correct patient, procedure, equipment, support staff and site/side marked as required.  Location details: right posterior hand  Wound Appearance: erythematous  Sutures/Staples Removed: 6  Facility: sutures placed in this facility Patient tolerance: Patient tolerated the procedure well with no immediate complications.  INCISION AND DRAINAGE Performed by: Roxy Horseman Consent: Verbal consent obtained. Risks and benefits: risks, benefits and alternatives were discussed Type: abscess  Body area: right hand, dorsal aspect  Anesthesia: local infiltration  Incision was made with a scalpel.  Local anesthetic: lidocaine 1% no epinephrine  Anesthetic total: 2 ml   Complexity: complex Blunt dissection to break up loculations  Drainage: bloody  Drainage amount: none  Packing material: none  Patient tolerance: Patient tolerated the procedure well with no immediate complications.  Copiously irrigated.   Medications Ordered in ED Medications  lidocaine (XYLOCAINE) 1 % (with pres) injection (has no administration in time range)  lidocaine (XYLOCAINE) 1 % (with pres) injection (has no administration in time range)  bacitracin ointment 1 application (has no administration in time range)  doxycycline (VIBRA-TABS) tablet 100 mg (has no administration in time range)     Initial Impression / Assessment and Plan / ED Course  I have reviewed the triage vital signs and the nursing notes.  Pertinent labs & imaging results that were available during my care of the patient were reviewed by me and considered in my medical decision making (see chart for details).        Patient here with developing wound infection.  He has mild erythema and swelling around the laceration repair on his posterior right hand.    I removed the sutures, re-opened the lacerations, and copiously irrigated the wounds.    Will give first dose of doxy.   Strict return precautions given.  Patient understands and agrees with the plan.  Final Clinical Impressions(s) / ED Diagnoses   Final diagnoses:  Wound infection    ED Discharge Orders         Ordered    doxycycline (VIBRAMYCIN) 100 MG capsule  2 times daily     04/14/18 2309           Roxy Horseman, PA-C 04/14/18 2312    Cardama, Amadeo Garnet, MD 04/15/18 [phone]

## 2018-10-13 ENCOUNTER — Other Ambulatory Visit: Payer: Self-pay

## 2018-10-13 ENCOUNTER — Ambulatory Visit (HOSPITAL_COMMUNITY)
Admission: EM | Admit: 2018-10-13 | Discharge: 2018-10-13 | Disposition: A | Discharge status: Home / Self Care | Payer: 59 | Attending: Family Medicine | Admitting: Family Medicine

## 2018-10-13 ENCOUNTER — Encounter (HOSPITAL_COMMUNITY): Payer: Self-pay

## 2018-10-13 DIAGNOSIS — W503XXA Accidental bite by another person, initial encounter: Secondary | ICD-10-CM

## 2018-10-13 DIAGNOSIS — S40872A Other superficial bite of left upper arm, initial encounter: Secondary | ICD-10-CM | POA: Diagnosis not present

## 2018-10-13 DIAGNOSIS — Z20822 Contact with and (suspected) exposure to covid-19: Secondary | ICD-10-CM
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
## 2018-10-14 LAB — NOVEL CORONAVIRUS, NAA: SARS-CoV-2, NAA: NOT DETECTED

## 2019-03-05 ENCOUNTER — Other Ambulatory Visit: Payer: Self-pay

## 2019-03-05 ENCOUNTER — Emergency Department (HOSPITAL_COMMUNITY): Admission: EM | Admit: 2019-03-05 | Discharge: 2019-03-05 | Discharge status: Absent without leave | Payer: 59

## 2022-04-14 ENCOUNTER — Encounter (HOSPITAL_COMMUNITY): Payer: Self-pay | Admitting: Emergency Medicine

## 2022-04-14 ENCOUNTER — Ambulatory Visit (HOSPITAL_COMMUNITY)
Admission: EM | Admit: 2022-04-14 | Discharge: 2022-04-14 | Disposition: A | Discharge status: Home / Self Care | Payer: BC Managed Care – PPO | Attending: Physician Assistant | Admitting: Physician Assistant

## 2022-04-14 DIAGNOSIS — L2082 Flexural eczema: Secondary | ICD-10-CM | POA: Diagnosis not present

## 2022-04-14 DIAGNOSIS — Z7689 Persons encountering health services in other specified circumstances: Secondary | ICD-10-CM

## 2022-04-14 MED ORDER — TRIAMCINOLONE ACETONIDE 0.1 % EX OINT: TOPICAL_OINTMENT | Freq: Two times a day (BID) | CUTANEOUS | 0 refills | Status: AC | PRN

## 2022-04-14 NOTE — ED Provider Notes (Signed)
Albert Moon    CSN: [id number] Arrival date & time: 04/14/22  1116      History   Chief Complaint Chief Complaint  Patient presents with   Letter for School/Work    HPI Albert Moon is a 27 y.o. male.   Patient presents today requesting a return to work note.  Reports that last week he was sick with URI symptoms including body ache, cough, congestion.  He took 2 at home COVID test during the course of his illness that were negative.  He has had some ongoing congestion but attributes this to his seasonal allergies which he has been using over-the-counter antihistamines to manage.  Denies any recent antibiotics or steroids.  Reports that he is returned to his normal self and he continues to have some mild fatigue as well as brain fog but generally is feeling much better.  He feels he can return to work but requires a note for his HR department guidelines.  In addition, he reports a several week history of pruritic rash in his antecubital region and several areas on his trunk.  He denies formal diagnosis of eczema but does have a chronic skin lesion on his right lower leg that has been managed by his PCP.  He has not seen a dermatologist.  He has tried topical steroids over affected area with minimal improvement of symptoms.  Denies any changes to personal hygiene products including soaps, detergents, medications.    History reviewed. No pertinent past medical history.  There are no problems to display for this patient.   History reviewed. No pertinent surgical history.     Home Medications    Prior to Admission medications   Medication Sig Start Date End Date Taking? Authorizing Provider  triamcinolone 0.1% oint-Eucerin equivalent cream 1:1 mixture Apply topically 2 (two) times daily as needed. 04/14/22  Yes Tinamarie Przybylski, Derry Skill, PA-C  acetaminophen (TYLENOL) 500 MG tablet Take 500 mg by mouth every 6 (six) hours as needed.    [provider]    Family  History Family History  Problem Relation Age of Onset   Diabetes Other    Hypertension Other     Social History Social History   Tobacco Use   Smoking status: Never   Smokeless tobacco: Never  Substance Use Topics   Alcohol use: No   Drug use: No     Allergies   Patient has no known allergies.   Review of Systems Review of Systems  Constitutional:  Positive for fatigue (Improving). Negative for activity change, appetite change and fever.  HENT:  Negative for congestion (Resolved), sinus pressure, sneezing and sore throat.   Respiratory:  Negative for cough (Resolved) and shortness of breath.   Cardiovascular:  Negative for chest pain.  Gastrointestinal:  Negative for abdominal pain, diarrhea, nausea and vomiting.  Skin:  Positive for rash.  Neurological:  Negative for dizziness and headaches.     Physical Exam Triage Vital Signs ED Triage Vitals  Enc Vitals Group     BP 04/14/22 1328 130/80     Pulse Rate 04/14/22 1328 (!) 55     Resp 04/14/22 1328 14     Temp 04/14/22 1328 98.1 F (36.7 C)     Temp Source 04/14/22 1328 Oral     SpO2 04/14/22 1328 98 %     Weight --      Height --      Head Circumference --      Peak Flow --  Pain Score 04/14/22 1327 0     Pain Loc --      Pain Edu? --      Excl. in [place]? --    No data found.  Updated Vital Signs BP 130/80 (BP Location: Left Arm)   Pulse (!) 55   Temp 98.1 F (36.7 C) (Oral)   Resp 14   SpO2 98%   Visual Acuity Right Eye Distance:   Left Eye Distance:   Bilateral Distance:    Right Eye Near:   Left Eye Near:    Bilateral Near:     Physical Exam Vitals reviewed.  Constitutional:      General: He is awake.     Appearance: Normal appearance. He is well-developed. He is not ill-appearing.     Comments: Very pleasant male appears stated age in no acute distress sitting comfortably in exam room  HENT:     Head: Normocephalic and atraumatic.     Right Ear: Tympanic membrane, ear canal and  external ear normal. Tympanic membrane is not erythematous or bulging.     Left Ear: Tympanic membrane, ear canal and external ear normal. Tympanic membrane is not erythematous or bulging.     Nose: Nose normal.     Mouth/Throat:     Pharynx: Uvula midline. No oropharyngeal exudate or posterior oropharyngeal erythema.  Cardiovascular:     Rate and Rhythm: Normal rate and regular rhythm.     Heart sounds: Normal heart sounds, S1 normal and S2 normal. No murmur heard. Pulmonary:     Effort: Pulmonary effort is normal. No accessory muscle usage or respiratory distress.     Breath sounds: Normal breath sounds. No stridor. No wheezing, rhonchi or rales.     Comments: Clear to auscultation bilaterally Skin:    Findings: Rash present. Rash is macular and papular.     Comments: Maculopapular rash noted anterior chest, bilateral antecubital spaces, lower back with evidence of excoriation.  No bleeding or drainage noted.  No associated erythema or streaking.  Neurological:     Mental Status: He is alert.  Psychiatric:        Behavior: Behavior is cooperative.      UC Treatments / Results  Labs (all labs ordered are listed, but only abnormal results are displayed) Labs Reviewed - No data to display  EKG   Radiology No results found.  Procedures Procedures (including critical care time)  Medications Ordered in UC Medications - No data to display  Initial Impression / Assessment and Plan / UC Course  I have reviewed the triage vital signs and the nursing notes.  Pertinent labs & imaging results that were available during my care of the patient were reviewed by me and considered in my medical decision making (see chart for details).     Patient is well-appearing, afebrile, nontoxic, nontachycardic.  Patient reports that he is essentially recovered from what sounds like a viral upper respiratory infection.  Encouraged him to rest and drink plenty of fluid.  He is safe to return as  even if he had COVID he is outside the window needed to quarantine.  He was provided a work excuse note allowing him to return.  Recommend he continue over-the-counter medications to help manage symptoms.  If he develops any changing or worsening symptoms he needs to be reevaluated.  Concern for eczema as etiology of symptoms.  Patient was prescribed Eucerin/triamcinolone combination medication to be used twice daily as needed.  Given his history of recurrent skin  issues recommend he follow-up with dermatology was given correct information for local provider with instruction call to schedule an appointment.  He is to use hypoallergenic soaps and detergents.  Discussed that if he has any worsening or changing symptoms including spread of rash, fever, drainage he needs to be seen immediately.  Strict return precautions given.  Final Clinical Impressions(s) / UC Diagnoses   Final diagnoses:  Flexural eczema  Return to work exam     Discharge Instructions      I have included your work note.  Continue allergy medication.  If you develop any additional or changing symptoms please return for reevaluation.  I am concerned that your rash symptoms are related to eczema.  Please use triamcinolone/Eucerin twice daily as needed.  Use hypoallergenic soaps and detergents.  Follow-up with dermatology.  Call them to schedule appointment.  If anything changes or worsens please return for reevaluation.     ED Prescriptions     Medication Sig Dispense Auth. Provider   triamcinolone 0.1% oint-Eucerin equivalent cream 1:1 mixture Apply topically 2 (two) times daily as needed. 100 g Nyla Creason K, PA-C      PDMP not reviewed this encounter.   Terrilee Croak, PA-C 04/14/22 1500

## 2022-04-14 NOTE — Discharge Instructions (Signed)
I have included your work note.  Continue allergy medication.  If you develop any additional or changing symptoms please return for reevaluation.  I am concerned that your rash symptoms are related to eczema.  Please use triamcinolone/Eucerin twice daily as needed.  Use hypoallergenic soaps and detergents.  Follow-up with dermatology.  Call them to schedule appointment.  If anything changes or worsens please return for reevaluation.

## 2022-04-14 NOTE — ED Triage Notes (Signed)
Pt reports that was out of work recently for over a week due to night sweats, body aches, congestion, fatigue and needing note to return.  Reports also having skin changes on certain areas and wanting referral to dermatology. Areas itch, denies tried any creams or special lotions.

## 2022-10-31 ENCOUNTER — Encounter (HOSPITAL_COMMUNITY): Payer: Self-pay | Admitting: Emergency Medicine

## 2022-10-31 ENCOUNTER — Ambulatory Visit (HOSPITAL_COMMUNITY)
Admission: EM | Admit: 2022-10-31 | Discharge: 2022-10-31 | Disposition: A | Discharge status: Home / Self Care | Payer: Medicaid Other | Attending: Internal Medicine | Admitting: Internal Medicine

## 2022-10-31 DIAGNOSIS — S61320A Laceration with foreign body of right index finger with damage to nail, initial encounter: Secondary | ICD-10-CM

## 2022-10-31 DIAGNOSIS — Z23 Encounter for immunization: Secondary | ICD-10-CM | POA: Diagnosis not present

## 2022-10-31 MED ORDER — TETANUS-DIPHTH-ACELL PERTUSSIS 5-2.5-18.5 LF-MCG/0.5 IM SUSY
PREFILLED_SYRINGE | INTRAMUSCULAR | Status: AC
  Filled 2022-10-31: qty 0.5

## 2022-10-31 MED ORDER — TETANUS-DIPHTH-ACELL PERTUSSIS 5-2.5-18.5 LF-MCG/0.5 IM SUSY
0.5000 mL | PREFILLED_SYRINGE | Freq: Once | INTRAMUSCULAR | Status: AC
  Administered 2022-10-31: 0.5 mL via INTRAMUSCULAR

## 2022-10-31 NOTE — ED Triage Notes (Signed)
Pt cut right index finger 4 days ago with a knife. Concerned about needing stitches. Unknown last tetanus shot.

## 2022-10-31 NOTE — ED Provider Notes (Signed)
[facility]    CSN: [id number] Arrival date & time: 10/31/22  1933      History   Chief Complaint Chief Complaint  Patient presents with   Laceration    HPI Albert Moon is a 27 y.o. male.    Laceration Cut right index finger with a utility knife 6 days ago.  Is concerned because the wound is not healing as well as he would have hoped.  He is right-handed.  Last tetanus greater than 5 years.  Bleeding, redness, drainage, weakness.  History reviewed. No pertinent past medical history.  There are no problems to display for this patient.   History reviewed. No pertinent surgical history.     Home Medications    Prior to Admission medications   Medication Sig Start Date End Date Taking? Authorizing Provider  acetaminophen (TYLENOL) 500 MG tablet Take 500 mg by mouth every 6 (six) hours as needed.    [provider]  triamcinolone 0.1% oint-Eucerin equivalent cream 1:1 mixture Apply topically 2 (two) times daily as needed. 04/14/22   Raspet, Noberto Retort, PA-C    Family History Family History  Problem Relation Age of Onset   Diabetes Other    Hypertension Other     Social History Social History   Tobacco Use   Smoking status: Never   Smokeless tobacco: Never  Substance Use Topics   Alcohol use: No   Drug use: No     Allergies   Patient has no known allergies.   Review of Systems Review of Systems   Physical Exam Triage Vital Signs ED Triage Vitals  Encounter Vitals Group     BP 10/31/22 1946 107/72     Systolic BP Percentile --      Diastolic BP Percentile --      Pulse Rate 10/31/22 1946 64     Resp 10/31/22 1946 14     Temp 10/31/22 1946 98.5 F (36.9 C)     Temp Source 10/31/22 1946 Oral     SpO2 10/31/22 1946 97 %     Weight --      Height --      Head Circumference --      Peak Flow --      Pain Score 10/31/22 1945 2     Pain Loc --      Pain Education --      Exclude from Growth Chart --    No data  found.  Updated Vital Signs BP 107/72 (BP Location: Left Arm)   Pulse 64   Temp 98.5 F (36.9 C) (Oral)   Resp 14   SpO2 97%   Visual Acuity Right Eye Distance:   Left Eye Distance:   Bilateral Distance:    Right Eye Near:   Left Eye Near:    Bilateral Near:     Physical Exam Skin:    General: Skin is warm.     Comments: 1 cm linear laceration medial aspect right index finger over DIPJ, deep tissue involvement, wound edges are open good granulation, no erythema, mild maceration of wound edges.  No active drainage.  Digit with full range of motion, capillary refill brisk, no foreign body seen or palpated  Neurological:     Mental Status: He is alert and oriented to person, place, and time.      UC Treatments / Results  Labs (all labs ordered are listed, but only abnormal results are displayed) Labs Reviewed - No data to display  EKG   Radiology No results found.  Procedures Procedures (including critical care time)  Medications Ordered in UC Medications  Tdap (BOOSTRIX) injection 0.5 mL (has no administration in time range)    Initial Impression / Assessment and Plan / UC Course  I have reviewed the triage vital signs and the nursing notes.  Pertinent labs & imaging results that were available during my care of the patient were reviewed by me and considered in my medical decision making (see chart for details).     27 year old male presents with 19-day-old laceration to dominant hand right index finger.  No evidence of infection, will update tetanus, home care and follow-up reviewed.  Return for signs of infection or concerns Final Clinical Impressions(s) / UC Diagnoses   Final diagnoses:  None   Discharge Instructions   None    ED Prescriptions   None    PDMP not reviewed this encounter.   Meliton Rattan, [place] 10/31/22 1956

## 2022-10-31 NOTE — Discharge Instructions (Addendum)
Follow-up for signs of infection or concerns Keep Wound clean and dry

## 2023-01-13 ENCOUNTER — Other Ambulatory Visit: Payer: Self-pay

## 2023-01-13 ENCOUNTER — Emergency Department (HOSPITAL_BASED_OUTPATIENT_CLINIC_OR_DEPARTMENT_OTHER)
Admission: EM | Admit: 2023-01-13 | Discharge: 2023-01-14 | Disposition: A | Discharge status: Home / Self Care | Payer: PRIVATE HEALTH INSURANCE | Attending: Emergency Medicine | Admitting: Emergency Medicine

## 2023-01-13 ENCOUNTER — Encounter (HOSPITAL_BASED_OUTPATIENT_CLINIC_OR_DEPARTMENT_OTHER): Payer: Self-pay | Admitting: Emergency Medicine

## 2023-01-13 DIAGNOSIS — R000 Tachycardia, unspecified: Secondary | ICD-10-CM | POA: Diagnosis not present

## 2023-01-13 DIAGNOSIS — L509 Urticaria, unspecified: Secondary | ICD-10-CM

## 2023-01-13 DIAGNOSIS — Z5329 Procedure and treatment not carried out because of patient's decision for other reasons: Secondary | ICD-10-CM | POA: Insufficient documentation

## 2023-01-13 DIAGNOSIS — T7809XA Anaphylactic reaction due to other food products, initial encounter: Secondary | ICD-10-CM | POA: Insufficient documentation

## 2023-01-13 DIAGNOSIS — T781XXA Other adverse food reactions, not elsewhere classified, initial encounter: Secondary | ICD-10-CM | POA: Diagnosis present

## 2023-01-13 DIAGNOSIS — T782XXA Anaphylactic shock, unspecified, initial encounter: Secondary | ICD-10-CM

## 2023-01-13 LAB — CBC
HCT: 44.4 % (ref 39.0–52.0)
Hemoglobin: 15.1 g/dL (ref 13.0–17.0)
MCH: 28.7 pg (ref 26.0–34.0)
MCHC: 34 g/dL (ref 30.0–36.0)
MCV: 84.4 fL (ref 80.0–100.0)
Platelets: 274 10*3/uL (ref 150–400)
RBC: 5.26 MIL/uL (ref 4.22–5.81)
RDW: 12 % (ref 11.5–15.5)
WBC: 5.8 10*3/uL (ref 4.0–10.5)
nRBC: 0 % (ref 0.0–0.2)

## 2023-01-13 LAB — BASIC METABOLIC PANEL
Anion gap: 11 (ref 5–15)
BUN: 16 mg/dL (ref 6–20)
CO2: 22 mmol/L (ref 22–32)
Calcium: 9.1 mg/dL (ref 8.9–10.3)
Chloride: 102 mmol/L (ref 98–111)
Creatinine, Ser: 1.17 mg/dL (ref 0.61–1.24)
GFR, Estimated: >60 mL/min (ref >60)
Glucose, Bld: 126 mg/dL — ABNORMAL HIGH (ref 70–99)
Potassium: 3.3 mmol/L — ABNORMAL LOW (ref 3.5–5.1)
Sodium: 135 mmol/L (ref 135–145)

## 2023-01-13 MED ORDER — EPINEPHRINE 0.15 MG/0.3ML IJ SOAJ: 0.1500 mg | Freq: Once | INTRAMUSCULAR | Status: DC

## 2023-01-13 MED ORDER — HYDROXYZINE HCL 25 MG PO TABS
50.0000 mg | ORAL_TABLET | Freq: Once | ORAL | Status: AC
  Administered 2023-01-13: 50 mg via ORAL
  Filled 2023-01-13: qty 2

## 2023-01-13 MED ORDER — FAMOTIDINE IN NACL 20-0.9 MG/50ML-% IV SOLN
20.0000 mg | Freq: Once | INTRAVENOUS | Status: AC
  Administered 2023-01-13: 20 mg via INTRAVENOUS
  Filled 2023-01-13: qty 50

## 2023-01-13 MED ORDER — EPINEPHRINE 0.3 MG/0.3ML IJ SOAJ: 0.3000 mg | Freq: Once | INTRAMUSCULAR | Status: AC

## 2023-01-13 MED ORDER — EPINEPHRINE 0.3 MG/0.3ML IJ SOAJ
0.3000 mg | Freq: Once | INTRAMUSCULAR | Status: AC
  Administered 2023-01-13: 0.3 mg via INTRAMUSCULAR
  Filled 2023-01-13: qty 0.3

## 2023-01-13 MED ORDER — SODIUM CHLORIDE 0.9 % IV BOLUS
1000.0000 mL | Freq: Once | INTRAVENOUS | Status: AC
  Administered 2023-01-13: 1000 mL via INTRAVENOUS

## 2023-01-13 MED ORDER — EPINEPHRINE 0.3 MG/0.3ML IJ SOAJ
INTRAMUSCULAR | Status: AC
  Administered 2023-01-13: 0.3 mg via INTRAMUSCULAR
  Filled 2023-01-13: qty 0.3

## 2023-01-13 MED ORDER — METHYLPREDNISOLONE SODIUM SUCC 125 MG IJ SOLR
125.0000 mg | Freq: Once | INTRAMUSCULAR | Status: AC
  Administered 2023-01-13: 125 mg via INTRAVENOUS
  Filled 2023-01-13: qty 2

## 2023-01-13 MED ORDER — DIPHENHYDRAMINE HCL 25 MG PO CAPS
50.0000 mg | ORAL_CAPSULE | Freq: Once | ORAL | Status: AC
  Administered 2023-01-13: 50 mg via ORAL
  Filled 2023-01-13: qty 2

## 2023-01-13 NOTE — ED Notes (Signed)
Patients face, nose and nasolabial folds swollen. Patient having issues talking clearly. EDP made aware and went directly to room 10 for reassessment.

## 2023-01-13 NOTE — ED Notes (Signed)
Patient has relief of all itching at this time. Feeling much better than when he arrived at the facility.

## 2023-01-13 NOTE — ED Provider Notes (Signed)
 [facility] Provider Note   CSN: [id number] Arrival date & time: 01/13/23  2149     History  Chief Complaint  Patient presents with   Allergic Reaction    Albert Moon is a 27 y.o. male.  Patient complains of sudden onset of abdominal cramping shortness of breath throat tightness and full body rash.  Patient reports symptoms began after eating chicken Alfredo.  Patient reports that he had a similar reaction in the past but does not recall what caused it.  Patient denies any history of allergies.  He denies any history of asthma.  Patient denies any fever he denies any chills.   Allergic Reaction Presenting symptoms: difficulty breathing, difficulty swallowing, itching and rash   Severity:  Severe      Home Medications Prior to Admission medications   Medication Sig Start Date End Date Taking? Authorizing Provider  acetaminophen  (TYLENOL ) 500 MG tablet Take 500 mg by mouth every 6 (six) hours as needed.    [provider]  triamcinolone  0.1% oint-Eucerin equivalent cream 1:1 mixture Apply topically 2 (two) times daily as needed. 04/14/22   Raspet, Erin K, PA-C      Allergies    Patient has no known allergies.    Review of Systems   Review of Systems  HENT:  Positive for facial swelling and trouble swallowing.   Respiratory:  Positive for shortness of breath.   Skin:  Positive for itching and rash.  All other systems reviewed and are negative.   Physical Exam Updated Vital Signs BP 126/85 (BP Location: Left Arm)   Pulse 77   Temp 97.7 F (36.5 C)   Resp (!) 22   Ht 6' (1.829 m)   Wt 81.6 kg   SpO2 100%   BMI 24.41 kg/m  Physical Exam Vitals and nursing note reviewed.  Constitutional:      Appearance: He is well-developed.  HENT:     Head: Normocephalic and atraumatic.     Right Ear: External ear normal.     Left Ear: External ear normal.     Mouth/Throat:     Mouth: Mucous membranes are moist.      Pharynx: Posterior oropharyngeal erythema present.  Eyes:     Extraocular Movements: Extraocular movements intact.     Pupils: Pupils are equal, round, and reactive to light.  Cardiovascular:     Rate and Rhythm: Tachycardia present.  Pulmonary:     Breath sounds: Stridor present.     Comments: tachypnea Abdominal:     General: There is no distension.  Musculoskeletal:        General: Normal range of motion.     Cervical back: Normal range of motion.  Skin:    Comments: Full body hives, edema  Neurological:     General: No focal deficit present.     Mental Status: He is alert and oriented to person, place, and time.  Psychiatric:        Mood and Affect: Mood normal.     ED Results / Procedures / Treatments   Labs (all labs ordered are listed, but only abnormal results are displayed) Labs Reviewed  BASIC METABOLIC PANEL  CBC    EKG None  Radiology No results found.  Procedures .Critical Care  Performed by: Flint Sonny POUR, PA-C Authorized by: Flint Sonny POUR, PA-C   Critical care provider statement:    Critical care start time:  01/13/2023 9:45 PM   Critical care  end time:  01/13/2023 11:30 PM   Critical care time was exclusive of:  Separately billable procedures and treating other patients and teaching time   Critical care was necessary to treat or prevent imminent or life-threatening deterioration of the following conditions:  Cardiac failure, circulatory failure and respiratory failure   Critical care was time spent personally by me on the following activities:  Development of treatment plan with patient or surrogate, discussions with consultants, pulse oximetry, re-evaluation of patient's condition, examination of patient, evaluation of patient's response to treatment and obtaining history from patient or surrogate     Medications Ordered in ED Medications  famotidine  (PEPCID ) IVPB 20 mg premix (has no administration in time range)  methylPREDNISolone   sodium succinate (SOLU-MEDROL ) 125 mg/2 mL injection 125 mg (has no administration in time range)  diphenhydrAMINE  (BENADRYL ) capsule 50 mg (has no administration in time range)  sodium chloride  0.9 % bolus 1,000 mL (1,000 mLs Intravenous New Bag/Given 01/13/23 2214)  EPINEPHrine  (EPI-PEN) injection 0.3 mg (0.3 mg Intramuscular Given 01/13/23 2205)    ED Course/ Medical Decision Making/ A&P Clinical Course as of 01/13/23 2339  Tue Jan 13, 2023  2333 This is a 27 year old male presenting to ED with concern for anaphylaxis.  Patient reports he was eating a chicken dinner this evening and immediately began to have hives, tightness in his throat, difficulty speaking.  This happened 4 days ago when he waited out at home.  He does not have prior history of anaphylaxis.  On original presentation the patient did have a muffled voice and was diffusely covered in hives.  Blood pressure has been stable.  He has now received 2 rounds of epinephrine , with some initial improvement of his hives after the first round of epi, but concern for worsening muffled voice.  On my repeat reassessments, including now, I do not see any visible swelling of the posterior oropharynx or the tongue.  He did have some minor stridor on arrival but this appears to have improved since his first epinephrine . [MT]  2334 Given the fluctuating and changing symptoms, overall I do think the safest plan to be to send the patient to the main hospital emergency department, where he could potentially be evaluated by an ENT specialist if he continues to have muffled voice.  I spoke to Dr Roark ENT - he will be available as needed, after patient is reassessed on arrival in [facility] ED [MT]  2337 The patient and his girlfriend are made aware of plan and are both in agreement.  He will travel by ambulance [MT]    Clinical Course User Index [MT] Trifan, Donnice PARAS, MD                                 Medical Decision Making Patient presents to the  emergency department with full body rash, shortness of breath and throat tightness.  Amount and/or Complexity of Data Reviewed Independent Historian:     Details: Patient is here with spouse who is supportive Labs: ordered.    Details: Potassium is 3.3. glucose is 126.  Risk Prescription drug management. Risk Details: ED course triage nurse asked me to see the patient at triage.  Patient has full body hives, raspy voice, tachycardia and tachypnea.  Patient given epi subcu.  Patient moved into exam room placed on a monitor IV started patient given Solu-Medrol , Benadryl  and Pepcid .  Patient reports feeling better approximately 5 minutes after  epinephrine .  Dr. Cottie into see and evaluate. He added vistaril  to help with itching.  Pt improving.  RN concerned at 11:25pm that pt is more short of breath,  Pt has nasal swelling and feels like his throat is swollen.  Pt given 2nd dosage of epi.  Dr. Cottie spoke with Dr. Roark ENT.  Pt to be transported to [facility].  I spoke to Dr. Polina who accepts pt at [facility].               Final Clinical Impression(s) / ED Diagnoses Final diagnoses:  Anaphylaxis, initial encounter  Urticaria    Rx / DC Orders ED Discharge Orders     None         Maclain Cohron K, PA-C 01/13/23 2348    Trifan, Matthew J, MD 01/22/23 [phone]

## 2023-01-13 NOTE — ED Notes (Signed)
 ED Provider at bedside.

## 2023-01-13 NOTE — ED Triage Notes (Signed)
 Pt states abdominal cramps, inching, weakness lightheaded, after eating chicken alfredo. Know known allergies. Pt appears to have hives. In triage.

## 2023-01-13 NOTE — ED Notes (Signed)
ED Provider at bedside. 

## 2023-01-14 DIAGNOSIS — T781XXA Other adverse food reactions, not elsewhere classified, initial encounter: Secondary | ICD-10-CM | POA: Diagnosis present

## 2023-01-14 DIAGNOSIS — R000 Tachycardia, unspecified: Secondary | ICD-10-CM | POA: Diagnosis not present

## 2023-01-14 DIAGNOSIS — T7809XA Anaphylactic reaction due to other food products, initial encounter: Secondary | ICD-10-CM | POA: Diagnosis not present

## 2023-01-14 DIAGNOSIS — Z5329 Procedure and treatment not carried out because of patient's decision for other reasons: Secondary | ICD-10-CM | POA: Diagnosis not present

## 2023-01-14 MED ORDER — PREDNISONE 10 MG (21) PO TBPK: ORAL_TABLET | ORAL | 0 refills | Status: AC

## 2023-01-14 MED ORDER — EPINEPHRINE 0.3 MG/0.3ML IJ SOAJ: 0.3000 mg | INTRAMUSCULAR | 0 refills | Status: AC | PRN

## 2023-01-14 NOTE — ED Notes (Signed)
 ED TO INPATIENT HANDOFF REPORT  ED Nurse Name and Phone #: Medford Slocumb [facility] Paramedic [phone]  S Name/Age/Gender Albert Moon 28 y.o. male Room/Bed: MH10/MH10  Code Status   Code Status: Not on file  Home/SNF/Other Home Patient oriented to: self, place, time, and situation Is this baseline? Yes   Triage Complete: Triage complete  Chief Complaint Possible allergic reaction- Shortness of breath  Triage Note Pt states abdominal cramps, inching, weakness lightheaded, after eating chicken alfredo. Know known allergies. Pt appears to have hives. In triage.    Allergies No Known Allergies  Level of Care/Admitting Diagnosis ED Disposition     ED Disposition  Transfer via Transport   Condition  --   Comment  The patient appears reasonably stabilized for transfer considering the current resources, flow, and capabilities available in the ED at this time, and I doubt any other [facility] requiring further screening and/or treatment in the ED prior to transfer is p resent.          B Medical/Surgery History History reviewed. No pertinent past medical history. History reviewed. No pertinent surgical history.   A IV Location/Drains/Wounds Patient Lines/Drains/Airways Status     Active Line/Drains/Airways     Name Placement date Placement time Site Days   Peripheral IV 01/13/23 18 G 1 Anterior;Left;Proximal Forearm 01/13/23  2213  Forearm  1            Intake/Output Last 24 hours  Intake/Output Summary (Last 24 hours) at 01/14/2023 0021 Last data filed at 01/13/2023 2325 Gross per 24 hour  Intake 1044.07 ml  Output --  Net 1044.07 ml    Labs/Imaging Results for orders placed or performed during the hospital encounter of 01/13/23 (from the past 48 hours)  Basic metabolic panel     Status: Abnormal   Collection Time: 01/13/23 10:15 PM  Result Value Ref Range   Sodium 135 135 - 145 mmol/L   Potassium 3.3 (L) 3.5 - 5.1 mmol/L   Chloride 102 98 - 111  mmol/L   CO2 22 22 - 32 mmol/L   Glucose, Bld 126 (H) 70 - 99 mg/dL    Comment: Glucose reference range applies only to samples taken after fasting for at least 8 hours.   BUN 16 6 - 20 mg/dL   Creatinine, Ser 8.82 0.61 - 1.24 mg/dL   Calcium 9.1 8.9 - 89.6 mg/dL   GFR, Estimated >39 >39 mL/min    Comment: (NOTE) Calculated using the CKD-EPI Creatinine Equation (2021)    Anion gap 11 5 - 15    Comment: Performed at [facility], [address] [place], [place] [address]  CBC     Status: None   Collection Time: 01/13/23 10:15 PM  Result Value Ref Range   WBC 5.8 4.0 - 10.5 K/uL   RBC 5.26 4.22 - 5.81 MIL/uL   Hemoglobin 15.1 13.0 - 17.0 g/dL   HCT 55.5 60.9 - 47.9 %   MCV 84.4 80.0 - 100.0 fL   MCH 28.7 26.0 - 34.0 pg   MCHC 34.0 30.0 - 36.0 g/dL   RDW 87.9 88.4 - 84.4 %   Platelets 274 150 - 400 K/uL   nRBC 0.0 0.0 - 0.2 %    Comment: Performed at [facility], [address] [place], [place] [address]   No results found.  Pending Labs [employer] (From admission, onward)    None       Vitals/Pain Today's Vitals  01/13/23 2215 01/13/23 2230 01/13/23 2245 01/13/23 2300  BP:  115/70 127/69 127/82  Pulse: 77 66 93 (!) 107  Resp: (!) 22 18 (!) 21 19  Temp:      SpO2: 100% 98% 98% 95%  Weight:      Height:      PainSc:        Isolation Precautions No active isolations  Medications Medications  famotidine  (PEPCID ) IVPB 20 mg premix (0 mg Intravenous Stopped 01/13/23 2256)  methylPREDNISolone  sodium succinate (SOLU-MEDROL ) 125 mg/2 mL injection 125 mg (125 mg Intravenous Given 01/13/23 2224)  diphenhydrAMINE  (BENADRYL ) capsule 50 mg (50 mg Oral Given 01/13/23 2205)  sodium chloride  0.9 % bolus 1,000 mL ( Intravenous Stopped 01/13/23 2314)  EPINEPHrine  (EPI-PEN) injection 0.3 mg (0.3 mg Intramuscular Given 01/13/23 2205)  hydrOXYzine  (ATARAX ) tablet 50 mg (50 mg Oral Given 01/13/23 2301)  EPINEPHrine  (EPI-PEN) injection 0.3 mg (0.3 mg  Intramuscular Given 01/13/23 2325)    Mobility walks     Focused Assessments     R Recommendations: See Admitting Provider Note  Report given to:   Additional Notes:

## 2023-01-14 NOTE — ED Notes (Signed)
 EMS called for transport to ED of [facility], NO ETA Called [facility] and [facility] will be over an hr before arrival. Called @ 00:07

## 2023-01-14 NOTE — ED Notes (Addendum)
 [facility] en route to get patient ETA of 5-44mins  Received call @ 01:42 am

## 2023-01-14 NOTE — ED Provider Notes (Signed)
 Care assumed on arrival from [facility] with anaphylactic reaction requiring two doses of Epi. Sent for monitoring here in the event of worsening and need for surgical airway. He reports symptoms have completely resolved now and he is back to baseline.  Physical Exam  BP 120/64 (BP Location: Right Arm)   Pulse 66   Temp 97.8 F (36.6 C) (Oral)   Resp 15   Ht 6' (1.829 m)   Wt 81.6 kg   SpO2 100%   BMI 24.41 kg/m   Physical Exam Vitals and nursing note reviewed.  Constitutional:      Appearance: Normal appearance.  HENT:     Head: Normocephalic and atraumatic.     Nose: Nose normal.     Mouth/Throat:     Mouth: Mucous membranes are moist.     Comments: No angioedema Eyes:     Extraocular Movements: Extraocular movements intact.     Conjunctiva/sclera: Conjunctivae normal.  Cardiovascular:     Rate and Rhythm: Normal rate.  Pulmonary:     Effort: Pulmonary effort is normal.     Breath sounds: Normal breath sounds. No stridor.  Abdominal:     General: Abdomen is flat.     Palpations: Abdomen is soft.     Tenderness: There is no abdominal tenderness.  Musculoskeletal:        General: No swelling. Normal range of motion.     Cervical back: Neck supple.  Skin:    General: Skin is warm and dry.     Findings: No rash.  Neurological:     General: No focal deficit present.     Mental Status: He is alert.  Psychiatric:        Mood and Affect: Mood normal.    Procedures  Procedures  ED Course / MDM   Clinical Course as of 01/14/23 0245             Wed Jan 14, 2023  0242 Patient arrives from [facility] for monitoring after epipen  x 2 for anaphylactic reaction. On arrival here, he is asymptomatic, exam is negative. I discussed with him the typical monitoring period of minimum 4 hours, but he states he feels fine and wants to be discharged. He understands the risks of leaving including airway obstruction, hypoxic brain injury or death. He will sign AMA. He understands he can return at  any time. Rx for steroids and EpiPen .  [CS]    Clinical Course User Index [CS] Roselyn Carlin NOVAK, MD [MT] Cottie Donnice PARAS, MD   Medical Decision Making Amount and/or Complexity of Data Reviewed Labs: ordered.  Risk Prescription drug management.          Roselyn Carlin NOVAK, MD 01/14/23 [phone]

## 2023-02-19 ENCOUNTER — Ambulatory Visit: Payer: PRIVATE HEALTH INSURANCE | Admitting: Allergy & Immunology

## 2023-02-19 ENCOUNTER — Ambulatory Visit (INDEPENDENT_AMBULATORY_CARE_PROVIDER_SITE_OTHER): Payer: PRIVATE HEALTH INSURANCE | Admitting: Internal Medicine

## 2023-02-19 ENCOUNTER — Encounter: Payer: Self-pay | Admitting: Internal Medicine

## 2023-02-19 ENCOUNTER — Other Ambulatory Visit: Payer: Self-pay

## 2023-02-19 VITALS — BP 100/60 | HR 88 | Temp 98.3°F | Resp 20 | Ht 72.44 in | Wt 183.9 lb

## 2023-02-19 DIAGNOSIS — H1045 Other chronic allergic conjunctivitis: Secondary | ICD-10-CM

## 2023-02-19 DIAGNOSIS — J3089 Other allergic rhinitis: Secondary | ICD-10-CM

## 2023-02-19 DIAGNOSIS — T782XXA Anaphylactic shock, unspecified, initial encounter: Secondary | ICD-10-CM

## 2023-02-19 DIAGNOSIS — L308 Other specified dermatitis: Secondary | ICD-10-CM | POA: Diagnosis not present

## 2023-02-19 DIAGNOSIS — T782XXD Anaphylactic shock, unspecified, subsequent encounter: Secondary | ICD-10-CM

## 2023-02-19 MED ORDER — CETIRIZINE HCL 10 MG PO TABS: 10.0000 mg | ORAL_TABLET | Freq: Every day | ORAL | 5 refills | Status: AC

## 2023-02-19 MED ORDER — CLOBETASOL PROPIONATE 0.05 % EX OINT: TOPICAL_OINTMENT | CUTANEOUS | 1 refills | Status: DC

## 2023-02-19 NOTE — Patient Instructions (Signed)
 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 Discussed the unpredictability of food allergies and the potential for cross-reactivity with other capsaicin family peppers. -Avoid all peppers in all forms for the next two weeks. -Start Zyrtec  10mg  daily, hold three days before testing. -Order labs for spontaneous causes of anaphylaxis and peppers -Schedule skin testing in two weeks. -Pick up EpiPen  from pharmacy  - Written allergy  action plan given today  - Recommend alert bracelet   Eczema/ Inverse psoriasis (?)  Chronic eczema on leg, palms, and armpits. 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 -Start Clobetasol  0.05% twice a day on leg lesion, avoid use on armpits, groin, and face. -Refer to Dermatology for further evaluation and possible biopsy.  Seasonal Allergies Symptoms of sneezing, runny/stuffy nose, itchy/watery eyes, worse in spring and summer.  Aggravated by cannabis use -Continue Zyrtec  10mg  daily for allergy  symptoms. -Return to clinic for skin testing (1-55, pepper)  Follow up: in 2 weeks for testing, hold zyrtec  for 3 days prior   Thank you so much for letting me partake in your care today.  Don't hesitate to reach out if you have any additional concerns!  Hargis Springer, MD  [facility] [place]

## 2023-02-19 NOTE — Progress Notes (Signed)
 NEW PATIENT Date of Service/Encounter:  02/20/23 Referring provider: Jolee Madelin Patch, MD Primary care provider: Jolee Madelin Patch, MD  Subjective:  Albert Moon is a 28 y.o. male  presenting today for evaluation of anaphylaxis  History obtained from: chart review and patient.   Discussed the use of AI scribe software for clinical note transcription with the patient, who gave verbal consent to proceed.  History of Present Illness   Albert Moon is a 28 year old male who presents with allergic reactions. He is accompanied by Harlene, his girlfriend.  He experienced an allergic reaction on Christmas after consuming food with cayenne pepper, with immediate symptoms of itching. He took a hot shower, and the symptoms resolved by the next morning. He continued to use cayenne pepper in smaller amounts until New Year's. On New Year's, he consumed chicken alfredo with cayenne pepper and experienced immediate cramping and itching. He took ibuprofen  15-20 minutes after the reaction began, which did not alleviate the symptoms. He went to the ER where he received two doses of epinephrine  and Benadryl  due to worsening symptoms, including facial swelling and breathing difficulties. He attempted to reintroduce cayenne pepper in a small amount into grits, resulting in mild throat itching but no severe reaction. He has since avoided cayenne pepper. Has not picked up epipen .  He has been using tobasco sauce.   He has seasonal allergies, particularly in spring and summer, with symptoms including sneezing, runny nose, and itchy eyes. He occasionally uses Benadryl  or Zyrtec  for relief but not consistently.  Denies previous sinus infections.  No prior allergy  testing.    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 He was recently prescribed a prescription cream, but he cannot recall which one.   It has not helped.    He occasionally smokes cannabis, which causes eye itching. He is allergic to cats, particularly those with long hair.        Chart Review:  ED note 02/12/22:   This is a 28 year old male presenting to ED with concern for anaphylaxis.  Patient reports he was eating a chicken dinner this evening and immediately began to have hives, tightness in his throat, difficulty speaking.  This happened 4 days ago when he waited out at home.  He does not have prior history of anaphylaxis.  On original presentation the patient did have a muffled voice and was diffusely covered in hives.  Blood pressure has been stable.  He has now received 2 rounds of epinephrine , with some initial improvement of his hives after the first round of epi, but concern for worsening muffled voice.  On my repeat reassessments, including now, I do not see any visible swelling of the posterior oropharynx or the tongue.  He did have some minor stridor on arrival but this appears to have improved since his first epinephrine .     Other allergy  screening: Asthma: no Rhino conjunctivitis: yes Food allergy : yes Medication allergy : no Hymenoptera allergy : no Urticaria: no Eczema:yes History of recurrent infections suggestive of immunodeficency: no Vaccinations are up to date.   Past Medical History: Past Medical History:  Diagnosis Date   Eczema    Medication List:  Current Outpatient Medications  Medication Sig Dispense Refill   acetaminophen  (TYLENOL ) 500 MG tablet Take 500 mg by mouth every 6 (six) hours as needed.     cetirizine  (ZYRTEC  ALLERGY ) 10 MG tablet  Take 1 tablet (10 mg total) by mouth daily. 30 tablet 5   clobetasol  ointment (TEMOVATE ) 0.05 % Apply topically twice daily to BODY as needed for SEVERE red, sandpaper and thickened like rash.  Do not use on face, groin or armpits.  Use for up to two weeks at a time. 60 g 1   EPINEPHrine  0.3 mg/0.3 mL IJ SOAJ injection Inject 0.3 mg into the  muscle as needed for anaphylaxis. 1 each 0   triamcinolone  0.1% oint-Eucerin equivalent cream 1:1 mixture Apply topically 2 (two) times daily as needed. 100 g 0   predniSONE  (STERAPRED UNI-PAK 21 TAB) 10 MG (21) TBPK tablet 10mg  Tabs, 6 day taper. Use as directed 1 each 0   No current facility-administered medications for this visit.   Known Allergies:  No Known Allergies Past Surgical History: History reviewed. No pertinent surgical history. Family History: Family History  Problem Relation Age of Onset   Asthma Brother    Allergic rhinitis Brother    Diabetes Other    Hypertension Other    Social History: Linda lives [facility], no pets.   ROS:  All other systems negative except as noted per HPI.  Objective:  Blood pressure 100/60, pulse 88, temperature 98.3 F (36.8 C), resp. rate 20, height 6' 0.44 (1.84 m), weight 183 lb 14.4 oz (83.4 kg), SpO2 100%. Body mass index is 24.64 kg/m. Physical Exam:  General Appearance:  Alert, cooperative, no distress, appears stated age  Head:  Normocephalic, without obvious abnormality, atraumatic  Eyes:  Conjunctiva clear, EOM's intact  Ears EACs normal bilaterally  Nose: Nares normal,  pale mucosa clear rhinorrhea, hypertrophic turbinates, no visible anterior polyps, and septum midline  Throat: Lips, tongue normal; teeth and gums normal, + cobblestoning  Neck: Supple, symmetrical  Lungs:   clear to auscultation bilaterally, Respirations unlabored, no coughing  Heart:  regular rate and rhythm and no murmur, Appears well perfused  Extremities: No edema  Skin: Hyperpigmented patches in armpits, large lichenified hyperpigmented patch on right leg with scaling  Neurologic: No gross deficits   Diagnostics: None done   Labs:  Lab Orders         Allergen,Chili Pepper,[id number]         Allergen,Grn Pepper,Paprika,[id number]         Allergen Landy Edison Pepper IgE         Allergen, Black Pepper,[id number]         Tryptase       Assessment and Plan   Assessment and Plan    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 Discussed the unpredictability of food allergies and the potential for cross-reactivity with other capsaicin family peppers. -Avoid all peppers in all forms for the next two weeks. -Start Zyrtec  10mg  daily, hold three days before testing. -Order labs for spontaneous causes of anaphylaxis and peppers -Schedule skin testing in two weeks. -Pick up EpiPen  from pharmacy  - Written allergy  action plan given today  - Recommend alert bracelet   Eczema/ Inverse psoriasis (?)  Chronic eczema on leg, palms, and armpits. 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 -Start Clobetasol  0.05% twice a day on leg lesion, avoid use on armpits, groin, and face. -Refer to Dermatology for further evaluation and possible biopsy.  Seasonal Allergies Symptoms of sneezing, runny/stuffy nose, itchy/watery eyes, worse in spring and summer.  Aggravated by cannabis use -Continue Zyrtec  10mg  daily for allergy  symptoms. -Return to clinic for skin testing (1-55,  pepper)  Follow up: in 2 weeks for testing, hold zyrtec  for 3 days prior       This note in its entirety was forwarded to the Provider who requested this consultation.  Other:     Thank you for your kind referral. I appreciate the opportunity to take part in Rudra's care. Please do not hesitate to contact me with questions.  Sincerely,  Thank you so much for letting me partake in your care today.  Don't hesitate to reach out if you have any additional concerns!  Hargis Springer, MD  [facility] [place], [place]

## 2023-02-22 LAB — TRYPTASE: Tryptase: 5.9 ug/L (ref 2.2–13.2)

## 2023-02-22 LAB — ALLERGEN, BLACK PEPPER,F280: Allergen Black Pepper IgE: <0.1 kU/L

## 2023-02-22 LAB — F263-IGE GREEN BELL PEPPER

## 2023-02-22 LAB — ALLERGEN,GRN PEPPER,PAPRIKA,F218: Paprika IgE: 0.37 kU/L — AB

## 2023-02-22 LAB — ALLERGEN,CHILI PEPPER,RF279

## 2023-02-26 ENCOUNTER — Telehealth: Payer: Self-pay

## 2023-02-26 NOTE — Telephone Encounter (Signed)
Rachelle informed me that the [facility] Pepper IgE that was ordered on 02/19/23 and drawn was not performed due to no reagent. Per Rachelle reagent will be available sometime around February 25th. Boykin Reaper stated if lab is needed we can do a redraw at the end of February.

## 2023-02-27 NOTE — Telephone Encounter (Signed)
Thanks for letting me know!

## 2023-03-06 ENCOUNTER — Ambulatory Visit: Payer: PRIVATE HEALTH INSURANCE | Admitting: Internal Medicine

## 2023-03-06 DIAGNOSIS — J3089 Other allergic rhinitis: Secondary | ICD-10-CM | POA: Diagnosis not present

## 2023-03-06 DIAGNOSIS — H1045 Other chronic allergic conjunctivitis: Secondary | ICD-10-CM

## 2023-03-06 DIAGNOSIS — J302 Other seasonal allergic rhinitis: Secondary | ICD-10-CM

## 2023-03-06 DIAGNOSIS — T782XXD Anaphylactic shock, unspecified, subsequent encounter: Secondary | ICD-10-CM | POA: Diagnosis not present

## 2023-03-06 DIAGNOSIS — L308 Other specified dermatitis: Secondary | ICD-10-CM

## 2023-03-06 NOTE — Progress Notes (Signed)
Date of Service/Encounter:  03/06/23  Allergy testing appointment   Initial visit on 02/19/23, seen for anaphylaxis, eczema .  Please see that note for additional details.  Today reports for allergy diagnostic testing:    DIAGNOSTICS:  Skin Testing: Environmental allergy panel and select foods. Adequate positive and negative controls Results discussed with patient/family.  Airborne Adult Perc - 03/06/23 1419     Time Antigen Placed 1420    Allergen Manufacturer Waynette Buttery    Location Back    Number of Test 55    Panel 1 Select    1. Control-Buffer 50% Glycerol Negative    2. Control-Histamine 4+    3. Bahia 4+    4. [place] 4+    5. Johnson 4+    6. Kentucky Blue 4+    7. Meadow Fescue 4+    8. Perennial Rye 4+    9. Timothy 4+    10. Ragweed Mix Negative    11. Cocklebur Negative    12. Plantain,  English 3+    13. Baccharis 3+    14. Dog Fennel 3+    15. Russian Thistle Negative    16. Lamb's Quarters Negative    17. Sheep Sorrell Negative    18. Rough Pigweed 3+    19. Marsh Elder, Rough 3+    20. Mugwort, Common 4+    21. Box, Elder Negative    22. Cedar, red 4+    23. Sweet Gum 4+    24. Pecan Pollen Negative    25. Pine Mix Negative    26. Walnut, Black Pollen Negative    27. Red Mulberry 3+    28. Ash Mix 3+    29. Birch Mix 4+    30. Beech American 4+    31. Cottonwood, [place] 2+    32. Hickory, White Negative    33. Maple Mix Negative    34. Oak, [place] Mix 3+    35. Sycamore Eastern 3+    36. Alternaria Alternata 3+    37. Cladosporium Herbarum 3+    38. Aspergillus Mix Negative    39. Penicillium Mix Negative    40. Bipolaris Sorokiniana (Helminthosporium) 3+    41. Drechslera Spicifera (Curvularia) Negative    42. Mucor Plumbeus 2+    43. Fusarium Moniliforme Negative    44. Aureobasidium Pullulans (pullulara) Negative    45. Rhizopus Oryzae Negative    46. Botrytis Cinera Negative    47. Epicoccum Nigrum Negative    48. Phoma Betae 3+     49. Dust Mite Mix 4+    50. Cat Hair 10,000 BAU/ml 3+    51.  Dog Epithelia Negative    52. Mixed Feathers Negative    53. Horse Epithelia Negative    54. Cockroach, German Negative    55. Tobacco Leaf 4+             Intradermal - 03/06/23 1518     Time Antigen Placed 0300    Allergen Manufacturer Waynette Buttery    Location Arm    Number of Test 6    Control Negative    Ragweed Mix 2+    Mold 2 Negative    Mold 4 Negative    Dog Negative    Cockroach Negative             Food Adult Perc - 03/06/23 1400     Time Antigen Placed 0220    Allergen Manufacturer Waynette Buttery    Location Back  Number of allergen test 2    45. Green Pepper --   5x20   71. Pepper, Black Negative             Allergy testing results were read and interpreted by myself, documented by clinical staff.  Patient provided with copy of allergy testing along with avoidance measures when indicated.   Ferol Luz, MD  [facility] [place]

## 2023-03-06 NOTE — Patient Instructions (Addendum)
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 Discussed the unpredictability of food allergies and the potential for cross-reactivity with other capsaicin family peppers. -Allergy testing positive to green pepper negative to black pepper -Blood work positive for paprika -Strict avoidance of cayenne pepper -Since he has tolerated green pepper and paprika since his reaction he can continue eating these foods -Compare hot sauces for those that you have tolerated there is that you have reacted to to isolate any specific ingredients that we have not tested for -Continue to carry epipen  - Follow written allergy action plan - Recommend alert bracelet   Eczema/ Inverse psoriasis (?)  Chronic eczema on leg, palms, and armpits. 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 -Continue  Clobetasol 0.05% twice a day on leg lesion, avoid use on armpits, groin, and face. -Dermatology referral pending  Seasonal Allergies Symptoms of sneezing, runny/stuffy nose, itchy/watery eyes, worse in spring and summer.  Aggravated by cannabis use Allergy test: positive to grass, weed, tree, mold, cat, dust mite  -Continue Zyrtec 10mg  daily for allergy symptoms. - Start avoidance measures   Follow up: in 6 months  Thank you so much for letting me partake in your care today.  Don't hesitate to reach out if you have any additional concerns!  Ferol Luz, MD  [facility] [place], [place]    Reducing Pollen Exposure  The American Academy of Allergy, Asthma and Immunology suggests the following steps to reduce your exposure to pollen during allergy seasons.    Do not hang sheets or clothing out to dry; pollen may collect on these items. Do not mow lawns or spend time around freshly cut grass; mowing stirs up pollen. Keep windows closed at night.  Keep car windows closed while driving. Minimize morning  activities outdoors, a time when pollen counts are usually at their highest. Stay indoors as much as possible when pollen counts or humidity is high and on windy days when pollen tends to remain in the air longer. Use air conditioning when possible.  Many air conditioners have filters that trap the pollen spores. Use a HEPA room air filter to remove pollen form the indoor air you breathe.  Control of Mold Allergen   Mold and fungi can grow on a variety of surfaces provided certain temperature and moisture conditions exist.  Outdoor molds grow on plants, decaying vegetation and soil.  The major outdoor mold, Alternaria and Cladosporium, are found in very high numbers during hot and dry conditions.  Generally, a late Summer - Fall peak is seen for common outdoor fungal spores.  Rain will temporarily lower outdoor mold spore count, but counts rise rapidly when the rainy period ends.  The most important indoor molds are Aspergillus and Penicillium.  Dark, humid and poorly ventilated basements are ideal sites for mold growth.  The next most common sites of mold growth are the bathroom and the kitchen.  Outdoor (Seasonal) Mold Control  Use air conditioning and keep windows closed Avoid exposure to decaying vegetation. Avoid leaf raking. Avoid grain handling. Consider wearing a face mask if working in moldy areas.    Indoor (Perennial) Mold Control   Maintain humidity below 50%. Clean washable surfaces with 5% bleach solution. Remove sources e.g. contaminated carpets.    Control of Dog or Cat Allergen  Avoidance is the best way to manage a dog or cat allergy. If you have a dog or cat and are allergic  to dog or cats, consider removing the dog or cat from the home. If you have a dog or cat but don't want to find it a new home, or if your family wants a pet even though someone in the household is allergic, here are some strategies that may help keep symptoms at bay:  Keep the pet out of your  bedroom and restrict it to only a few rooms. Be advised that keeping the dog or cat in only one room will not limit the allergens to that room. Don't pet, hug or kiss the dog or cat; if you do, wash your hands with soap and water. High-efficiency particulate air (HEPA) cleaners run continuously in a bedroom or living room can reduce allergen levels over time. Regular use of a high-efficiency vacuum cleaner or a central vacuum can reduce allergen levels. Giving your dog or cat a bath at least once a week can reduce airborne allergen.  DUST MITE AVOIDANCE MEASURES:  There are three main measures that need and can be taken to avoid house dust mites:  Reduce accumulation of dust in general -reduce furniture, clothing, carpeting, books, stuffed animals, especially in bedroom  Separate yourself from the dust -use pillow and mattress encasements (can be found at stores such as [employer] or online) -avoid direct exposure to air condition flow -use a HEPA filter device, especially in the bedroom; you can also use a HEPA filter vacuum cleaner -wipe dust with a moist towel instead of a dry towel or broom when cleaning  Decrease mites and/or their secretions -wash clothing and linen and stuffed animals at highest temperature possible, at least every 2 weeks -stuffed animals can also be placed in a bag and put in a freezer overnight  Despite the above measures, it is impossible to eliminate dust mites or their allergen completely from your home.  With the above measures the burden of mites in your home can be diminished, with the goal of minimizing your allergic symptoms.  Success will be reached only when implementing and using all means together.

## 2023-10-01 DIAGNOSIS — G4452 New daily persistent headache (NDPH): Secondary | ICD-10-CM | POA: Diagnosis not present

## 2023-10-01 DIAGNOSIS — G43909 Migraine, unspecified, not intractable, without status migrainosus: Secondary | ICD-10-CM | POA: Diagnosis not present

## 2023-11-19 DIAGNOSIS — G43009 Migraine without aura, not intractable, without status migrainosus: Secondary | ICD-10-CM | POA: Diagnosis not present

## 2023-11-25 ENCOUNTER — Encounter: Payer: Self-pay | Admitting: Physician Assistant

## 2023-11-25 ENCOUNTER — Ambulatory Visit: Payer: PRIVATE HEALTH INSURANCE | Admitting: Physician Assistant

## 2023-11-25 VITALS — BP 97/62 | HR 78

## 2023-11-25 DIAGNOSIS — L409 Psoriasis, unspecified: Secondary | ICD-10-CM

## 2023-11-25 MED ORDER — CLOBETASOL PROPIONATE 0.05 % EX OINT: TOPICAL_OINTMENT | CUTANEOUS | 2 refills | Status: AC

## 2023-11-25 NOTE — Patient Instructions (Signed)

## 2023-11-25 NOTE — Progress Notes (Signed)
   New Patient Visit   Subjective  Albert Moon is a 28 y.o. male NEW PATIENT who presents for the following: Dry skin  Patient states he  has dry skin located at the Hands and buttocks area that he  would like to have examined. Patient reports the areas have been there for 3 years. He reports the areas are bothersome.Patient rates irritation 5 out of 10. He states that the areas have spread to his hands and groin area. 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 Patient denies Hx of bx. Patient admits family history of Psoriasis.    The following portions of the chart were reviewed this encounter and updated as appropriate: medications, allergies, medical history  Review of Systems:  No other skin or systemic complaints except as noted in HPI or Assessment and Plan.  Objective  Well appearing patient in no apparent distress; mood and affect are within normal limits.  A full examination was performed including scalp, head, eyes, ears, nose, lips, neck, chest, axillae, abdomen, back, buttocks, bilateral upper extremities, bilateral lower extremities, hands, feet, fingers, toes, fingernails, and toenails. All findings within normal limits unless otherwise noted below.   A focused examination was performed of the following areas: Hands, legs, buttocks and scalp  Relevant exam findings are noted in the Assessment and Plan.         Assessment & Plan   PSORIASIS Exam: Well-demarcated erythematous papules/plaques with silvery scale, guttate pink scaly papules. 10 % BSA.  Not at goal  patient denies joint pain  Psoriasis is a chronic non-curable, but treatable genetic/hereditary disease that may have other systemic features affecting other organ systems such as joints (Psoriatic Arthritis). It is associated with an increased risk of inflammatory bowel disease, heart disease, non-alcoholic  fatty liver disease, and depression.  Treatments include light and laser treatments; topical medications; and systemic medications including oral and injectables.  Treatment Plan: Clobetasol  ointment twice daily as needed   PSORIASIS   Related Medications clobetasol  ointment (TEMOVATE ) 0.05 % Apply topically twice daily to effected areas as needed.  Return if symptoms worsen or fail to improve.  I, Doyce Pan, CMA, am acting as scribe for Cherrish Vitali K, PA-C.   Documentation: I have reviewed the above documentation for accuracy and completeness, and I agree with the above.  Makari Portman K, PA-C
